# Patient Record
Sex: Female | Born: 1960 | Race: White | Hispanic: No | Marital: Married | Smoking: Never smoker
Health system: Southern US, Community
[De-identification: ages and names within clinical notes are randomized; demographics above are authoritative.]

## PROBLEM LIST (undated history)

## (undated) DIAGNOSIS — R42 Dizziness and giddiness: Secondary | ICD-10-CM

## (undated) DIAGNOSIS — E039 Hypothyroidism, unspecified: Secondary | ICD-10-CM

## (undated) DIAGNOSIS — J329 Chronic sinusitis, unspecified: Secondary | ICD-10-CM

## (undated) DIAGNOSIS — T7840XA Allergy, unspecified, initial encounter: Secondary | ICD-10-CM

## (undated) DIAGNOSIS — M1990 Unspecified osteoarthritis, unspecified site: Secondary | ICD-10-CM

## (undated) DIAGNOSIS — F419 Anxiety disorder, unspecified: Secondary | ICD-10-CM

## (undated) DIAGNOSIS — G4730 Sleep apnea, unspecified: Secondary | ICD-10-CM

## (undated) DIAGNOSIS — E785 Hyperlipidemia, unspecified: Secondary | ICD-10-CM

## (undated) DIAGNOSIS — K219 Gastro-esophageal reflux disease without esophagitis: Secondary | ICD-10-CM

## (undated) HISTORY — PX: TONSILLECTOMY: SUR1361

## (undated) HISTORY — DX: Hyperlipidemia, unspecified: E78.5

## (undated) HISTORY — DX: Allergy, unspecified, initial encounter: T78.40XA

## (undated) HISTORY — PX: CARPAL TUNNEL RELEASE: SHX101

## (undated) HISTORY — DX: Chronic sinusitis, unspecified: J32.9

---

## 2005-04-15 ENCOUNTER — Ambulatory Visit: Payer: Self-pay | Admitting: Internal Medicine

## 2005-05-16 ENCOUNTER — Encounter: Admission: RE | Admit: 2005-05-16 | Discharge: 2005-05-16 | Payer: Self-pay | Admitting: Orthopedic Surgery

## 2005-09-30 ENCOUNTER — Ambulatory Visit: Payer: Self-pay | Admitting: Internal Medicine

## 2005-10-01 ENCOUNTER — Ambulatory Visit: Payer: Self-pay | Admitting: Internal Medicine

## 2006-05-27 ENCOUNTER — Ambulatory Visit: Payer: Self-pay | Admitting: Internal Medicine

## 2006-06-26 ENCOUNTER — Ambulatory Visit: Payer: Self-pay | Admitting: Internal Medicine

## 2006-12-29 ENCOUNTER — Encounter: Admission: RE | Admit: 2006-12-29 | Discharge: 2006-12-29 | Payer: Self-pay | Admitting: Obstetrics and Gynecology

## 2007-02-27 ENCOUNTER — Ambulatory Visit: Payer: Self-pay | Admitting: Internal Medicine

## 2008-01-15 ENCOUNTER — Encounter: Admission: RE | Admit: 2008-01-15 | Discharge: 2008-01-15 | Payer: Self-pay | Admitting: Obstetrics

## 2008-05-20 DIAGNOSIS — J309 Allergic rhinitis, unspecified: Secondary | ICD-10-CM | POA: Insufficient documentation

## 2008-05-20 DIAGNOSIS — L509 Urticaria, unspecified: Secondary | ICD-10-CM | POA: Insufficient documentation

## 2008-05-20 DIAGNOSIS — T783XXA Angioneurotic edema, initial encounter: Secondary | ICD-10-CM | POA: Insufficient documentation

## 2008-05-23 ENCOUNTER — Ambulatory Visit: Payer: Self-pay | Admitting: Internal Medicine

## 2008-05-26 ENCOUNTER — Ambulatory Visit: Payer: Self-pay | Admitting: Internal Medicine

## 2009-02-08 ENCOUNTER — Encounter: Admission: RE | Admit: 2009-02-08 | Discharge: 2009-02-08 | Payer: Self-pay | Admitting: Obstetrics

## 2010-02-26 ENCOUNTER — Encounter: Admission: RE | Admit: 2010-02-26 | Discharge: 2010-02-26 | Payer: Self-pay | Admitting: Obstetrics

## 2010-12-02 ENCOUNTER — Encounter: Payer: Self-pay | Admitting: Orthopedic Surgery

## 2011-03-29 NOTE — Assessment & Plan Note (Signed)
[facility]                               PULMONARY OFFICE NOTE   NAME:Oguinn, IEASHA BOEREMA                       MRN:          [id number]  DATE:06/26/2006                            DOB:          [birth year]    PROBLEMS:  1. Allergic rhinitis.  2. Urticaria.  3. Angioedema.   HISTORY OF PRESENT ILLNESS:  She says this has been a very good year.  In  the last 2-3 days, just as she came back from the beach trip, she has  experienced nasal congestion, retro-orbital pressure and post nasal  drainage.  She saw her eye doctor who gave Alrex drops and Optive lubricant.  She has had no recent urticaria.  She continues allergy vaccine at 1-10,  given by a nurse neighbor, usually every other week.  She has had no  problems at all.  We spent time today discussing allergy vaccine goals,  risks and alternatives with an emphasis on policy concerns of administration  outside of a medical office anaphylaxis, epinephrine and oxygen.  She wishes  to continue as she is doing.  She reviewed and signed a waiver.   MEDICATIONS:  1. Allergy vaccine at 1-10.  2. Zyrtec 10 mg.  3. Wellbutrin.  4. Red rice yeast.   ALLERGIES:  DRUG INTOLERANCE TO PENICILLIN, SULFA, CODEINE, ERYTHROMYCIN AND  KEFLEX.  She has an Epi Pen with prescription refilled.   OBJECTIVE:  VITAL SIGNS:  Weight 272 pounds, blood pressure 110/80, pulse  rate 74, room air saturation 99%.  HEENT:  Periorbital edema.  Conjunctivae are clear.  Nasal mucosa is clear  and looks normal.  Pharynx is clear, but she may be very minimally hoarse  without stridor.  LUNGS:  Fields are clear with breathing unlabored.  HEART:  Sounds regular without murmur.  There is no obvious rash.   IMPRESSION:  Recent exacerbation of rhinitis or rhinosinusitis.  This may  have been a viral syndrome.  Context is not specific.   PLAN:  1. Educational discussion as above with decision to continue allergy      vaccine as she is  doing.  2. Epi Pen discussed and refilled.  3. Nasal Neo-Synephrine inhalation and Depo-Medrol 80 mg IM today.  4. Doxycycline, to hold, 100 mg #8 to the first day and then one daily, as      discussed.  5. Schedule return in one year, earlier p.r.n.                                   Clinton D. Maple Hudson, MD, FCCP, FACP   CDY/MedQ  DD:  06/26/2006  DT:  06/27/2006  Job #:  [id number]

## 2012-02-05 ENCOUNTER — Other Ambulatory Visit: Payer: Self-pay | Admitting: Obstetrics

## 2012-02-05 DIAGNOSIS — Z1231 Encounter for screening mammogram for malignant neoplasm of breast: Secondary | ICD-10-CM

## 2012-02-20 ENCOUNTER — Ambulatory Visit: Payer: Self-pay

## 2012-05-19 ENCOUNTER — Ambulatory Visit: Payer: Self-pay

## 2012-05-20 ENCOUNTER — Ambulatory Visit
Admission: RE | Admit: 2012-05-20 | Discharge: 2012-05-20 | Disposition: A | Discharge status: Home / Self Care | Payer: BC Managed Care – PPO | Source: Ambulatory Visit | Attending: Obstetrics | Admitting: Obstetrics

## 2012-05-20 DIAGNOSIS — Z1231 Encounter for screening mammogram for malignant neoplasm of breast: Secondary | ICD-10-CM

## 2013-01-30 ENCOUNTER — Ambulatory Visit (INDEPENDENT_AMBULATORY_CARE_PROVIDER_SITE_OTHER): Payer: BC Managed Care – PPO | Admitting: Family Medicine

## 2013-01-30 VITALS — BP 110/72 | HR 90 | Temp 98.2°F | Resp 16 | Ht 66.0 in | Wt 271.4 lb

## 2013-01-30 DIAGNOSIS — R059 Cough, unspecified: Secondary | ICD-10-CM

## 2013-01-30 DIAGNOSIS — D72829 Elevated white blood cell count, unspecified: Secondary | ICD-10-CM

## 2013-01-30 DIAGNOSIS — J029 Acute pharyngitis, unspecified: Secondary | ICD-10-CM

## 2013-01-30 DIAGNOSIS — M791 Myalgia, unspecified site: Secondary | ICD-10-CM

## 2013-01-30 DIAGNOSIS — IMO0001 Reserved for inherently not codable concepts without codable children: Secondary | ICD-10-CM

## 2013-01-30 DIAGNOSIS — R05 Cough: Secondary | ICD-10-CM

## 2013-01-30 LAB — POCT CBC
Granulocyte percent: 73.5 %G (ref 37–80)
HCT, POC: 41.4 % (ref 37.7–47.9)
Lymph, poc: 2.3 (ref 0.6–3.4)
MCHC: 31.4 g/dL — AB (ref 31.8–35.4)
MPV: 8.6 fL (ref 0–99.8)
Platelet Count, POC: 331 10*3/uL (ref 142–424)
RBC: 4.76 M/uL (ref 4.04–5.48)
RDW, POC: 13.6 %

## 2013-01-30 LAB — POCT INFLUENZA A/B: Influenza B, POC: NEGATIVE

## 2013-01-30 LAB — POCT RAPID STREP A (OFFICE): Rapid Strep A Screen: NEGATIVE

## 2013-01-30 MED ORDER — MAGIC MOUTHWASH W/LIDOCAINE: 5.0000 mL | Freq: Four times a day (QID) | ORAL | Status: DC | PRN

## 2013-01-30 MED ORDER — AZITHROMYCIN 250 MG PO TABS: ORAL_TABLET | ORAL | Status: DC

## 2013-01-30 NOTE — Patient Instructions (Addendum)
Start the antibiotic, mucinex if needed, magic mouthwash if needed - gargle then spit for sore throat.  Albuterol if wheezing, voice rest, fluids. recheck in next 2-3 days if not improving.  Return to the clinic or go to the nearest emergency room if any of your symptoms worsen or new symptoms occur. Sore Throat Sore throats may be caused by bacteria and viruses. They may also be caused by:  Smoking.  Pollution.  Allergies. If a sore throat is due to strep infection (a bacterial infection), you may need:  A throat swab.  A culture test to verify the strep infection. You will need one of these:  An antibiotic shot.  Oral medicine for a full 10 days. Strep infection is very contagious. A doctor should check any close contacts who have a sore throat or fever. A sore throat caused by a virus infection will usually last only 3-4 days. Antibiotics will not treat a viral sore throat.  Infectious mononucleosis (a viral disease), however, can cause a sore throat that lasts for up to 3 weeks. Mononucleosis can be diagnosed with blood tests. You must have been sick for at least 1 week in order for the test to give accurate results. HOME CARE INSTRUCTIONS   To treat a sore throat, take mild pain medicine.  Increase your fluids.  Eat a soft diet.  Do not smoke.  Gargling with warm water or salt water (1 tsp. salt in 8 oz. water) can be helpful.  Try throat sprays or lozenges or sucking on hard candy to ease the symptoms. Call your doctor if your sore throat lasts longer than 1 week.  SEEK IMMEDIATE MEDICAL CARE IF:  You have difficulty breathing.  You have increased swelling in the throat.  You have pain so severe that you are unable to swallow fluids or your saliva.  You have a severe headache, a high fever, vomiting, or a red rash. Document Released: 12/05/2004 Document Revised: 01/20/2012 Document Reviewed: 10/15/2007 [facility] Patient Information 2013 [place], [place].   Cough,  Adult  A cough is a reflex that helps clear your throat and airways. It can help heal the body or may be a reaction to an irritated airway. A cough may only last 2 or 3 weeks (acute) or may last more than 8 weeks (chronic).  CAUSES Acute cough:  Viral or bacterial infections. Chronic cough:  Infections.  Allergies.  Asthma.  Post-nasal drip.  Smoking.  Heartburn or acid reflux.  Some medicines.  Chronic lung problems (COPD).  Cancer. SYMPTOMS   Cough.  Fever.  Chest pain.  Increased breathing rate.  High-pitched whistling sound when breathing (wheezing).  Colored mucus that you cough up (sputum). TREATMENT   A bacterial cough may be treated with antibiotic medicine.  A viral cough must run its course and will not respond to antibiotics.  Your caregiver may recommend other treatments if you have a chronic cough. HOME CARE INSTRUCTIONS   Only take over-the-counter or prescription medicines for pain, discomfort, or fever as directed by your caregiver. Use cough suppressants only as directed by your caregiver.  Use a cold steam vaporizer or humidifier in your bedroom or home to help loosen secretions.  Sleep in a semi-upright position if your cough is worse at night.  Rest as needed.  Stop smoking if you smoke. SEEK IMMEDIATE MEDICAL CARE IF:   You have pus in your sputum.  Your cough starts to worsen.  You cannot control your cough with suppressants and are losing  sleep.  You begin coughing up blood.  You have difficulty breathing.  You develop pain which is getting worse or is uncontrolled with medicine.  You have a fever. MAKE SURE YOU:   Understand these instructions.  Will watch your condition.  Will get help right away if you are not doing well or get worse. Document Released: 04/26/2011 Document Revised: 01/20/2012 Document Reviewed: 04/26/2011 [facility] Patient Information 2013 [place], [place].

## 2013-01-30 NOTE — Progress Notes (Signed)
Subjective:    Patient ID: Barbara Michael, female    DOB: [birth year], 52 y.o.   MRN: [id number]  HPI Barbara Michael is a 52 y.o. female Started 3 days ago with sneezing, bodyaches.  initially thought was allergies. Asthma in past - but hasn't needed albuterol in years.  [facility],  rx mucinex DM and albuterol.  Feels worse today.  Bodyache, R and L ear sore.  Hurts to talk. Breathing is better.   Did not have flu vaccine - allergic to eggs.  SH: teacher - special needs.    Rash with erythromycin - but has taken azithro without difficulty.   Review of Systems  Constitutional: Positive for chills. Negative for fever.  HENT: Positive for ear pain, congestion, rhinorrhea and voice change.   Respiratory: Positive for cough (green sputum. ) and wheezing (past few days. using albuterol twice since yesterday. ).   Neurological: Negative for tremors.       Objective:   Physical Exam  Vitals reviewed. Constitutional: She is oriented to person, place, and time. She appears well-developed and well-nourished. No distress.  HENT:  Head: Normocephalic and atraumatic.  Right Ear: Hearing, tympanic membrane, external ear and ear canal normal.  Left Ear: Hearing, tympanic membrane, external ear and ear canal normal.  Nose: Nose normal. Right sinus exhibits no maxillary sinus tenderness and no frontal sinus tenderness. Left sinus exhibits no maxillary sinus tenderness and no frontal sinus tenderness.  Mouth/Throat: Oropharynx is clear and moist. No oropharyngeal exudate.  Hoarse voice.   Eyes: Conjunctivae and EOM are normal. Pupils are equal, round, and reactive to light.  Neck: Neck supple.  Cardiovascular: Normal rate, regular rhythm, normal heart sounds and intact distal pulses.   No murmur heard. Pulmonary/Chest: Effort normal and breath sounds normal. No respiratory distress. She has no wheezes. She has no rhonchi.  Lymphadenopathy:    She has no cervical  adenopathy.  Neurological: She is alert and oriented to person, place, and time.  Skin: Skin is warm and dry. No rash noted.  Psychiatric: She has a normal mood and affect. Her behavior is normal.   Results for orders placed in visit on 01/30/13  POCT CBC      Result Value Range   WBC 11.1 (*) 4.6 - 10.2 K/uL   Lymph, poc 2.3  0.6 - 3.4   POC LYMPH PERCENT 20.9  10 - 50 %L   MID (cbc) 0.6  0 - 0.9   POC MID % 5.6  0 - 12 %M   POC Granulocyte 8.2 (*) 2 - 6.9   Granulocyte percent 73.5  37 - 80 %G   RBC 4.76  4.04 - 5.48 M/uL   Hemoglobin 13.0  12.2 - 16.2 g/dL   HCT, POC 78.2  95.6 - 47.9 %   MCV 86.9  80 - 97 fL   MCH, POC 27.3  27 - 31.2 pg   MCHC 31.4 (*) 31.8 - 35.4 g/dL   RDW, POC 21.3     Platelet Count, POC 331  142 - 424 K/uL   MPV 8.6  0 - 99.8 fL  POCT RAPID STREP A (OFFICE)      Result Value Range   Rapid Strep A Screen Negative  Negative  POCT INFLUENZA A/B      Result Value Range   Influenza A, POC Negative     Influenza B, POC Negative         Assessment & Plan:  Barbara Michael is a 52 y.o. female Cough - Plan: POCT CBC, POCT rapid strep A, POCT Influenza A/B, azithromycin (ZITHROMAX) 250 MG tablet  Myalgia - Plan: POCT CBC, POCT rapid strep A, POCT Influenza A/B  Sore throat - Plan: Alum & Mag Hydroxide-Simeth (MAGIC MOUTHWASH W/LIDOCAINE) SOLN  Leukocytosis, unspecified - Plan: azithromycin (ZITHROMAX) 250 MG tablet   URI, cough sore throat - possible viral illness, but slight elevated WBC, - early bronchitis or CAP in ddx. Lungs clear on exam, cxr deferred today.  Start zpak, cont mucinex, sx care, MMW, rtc precautions.   Neck soreness- more anterior in description.  Not photophobic, and supple neck exam.  Rtc/er precautions.    Patient Instructions  Start the antibiotic, mucinex if needed, magic mouthwash if needed - gargle then spit for sore throat.  Albuterol if wheezing, voice rest, fluids. recheck in next 2-3 days if not improving.  Return to the  clinic or go to the nearest emergency room if any of your symptoms worsen or new symptoms occur. Sore Throat Sore throats may be caused by bacteria and viruses. They may also be caused by:  Smoking.  Pollution.  Allergies. If a sore throat is due to strep infection (a bacterial infection), you may need:  A throat swab.  A culture test to verify the strep infection. You will need one of these:  An antibiotic shot.  Oral medicine for a full 10 days. Strep infection is very contagious. A doctor should check any close contacts who have a sore throat or fever. A sore throat caused by a virus infection will usually last only 3-4 days. Antibiotics will not treat a viral sore throat.  Infectious mononucleosis (a viral disease), however, can cause a sore throat that lasts for up to 3 weeks. Mononucleosis can be diagnosed with blood tests. You must have been sick for at least 1 week in order for the test to give accurate results. HOME CARE INSTRUCTIONS   To treat a sore throat, take mild pain medicine.  Increase your fluids.  Eat a soft diet.  Do not smoke.  Gargling with warm water or salt water (1 tsp. salt in 8 oz. water) can be helpful.  Try throat sprays or lozenges or sucking on hard candy to ease the symptoms. Call your doctor if your sore throat lasts longer than 1 week.  SEEK IMMEDIATE MEDICAL CARE IF:  You have difficulty breathing.  You have increased swelling in the throat.  You have pain so severe that you are unable to swallow fluids or your saliva.  You have a severe headache, a high fever, vomiting, or a red rash. Document Released: 12/05/2004 Document Revised: 01/20/2012 Document Reviewed: 10/15/2007 [facility] Patient Information 2013 [place], [place].   Cough, Adult  A cough is a reflex that helps clear your throat and airways. It can help heal the body or may be a reaction to an irritated airway. A cough may only last 2 or 3 weeks (acute) or may last more than  8 weeks (chronic).  CAUSES Acute cough:  Viral or bacterial infections. Chronic cough:  Infections.  Allergies.  Asthma.  Post-nasal drip.  Smoking.  Heartburn or acid reflux.  Some medicines.  Chronic lung problems (COPD).  Cancer. SYMPTOMS   Cough.  Fever.  Chest pain.  Increased breathing rate.  High-pitched whistling sound when breathing (wheezing).  Colored mucus that you cough up (sputum). TREATMENT   A bacterial cough may be treated with antibiotic medicine.  A viral  cough must run its course and will not respond to antibiotics.  Your caregiver may recommend other treatments if you have a chronic cough. HOME CARE INSTRUCTIONS   Only take over-the-counter or prescription medicines for pain, discomfort, or fever as directed by your caregiver. Use cough suppressants only as directed by your caregiver.  Use a cold steam vaporizer or humidifier in your bedroom or home to help loosen secretions.  Sleep in a semi-upright position if your cough is worse at night.  Rest as needed.  Stop smoking if you smoke. SEEK IMMEDIATE MEDICAL CARE IF:   You have pus in your sputum.  Your cough starts to worsen.  You cannot control your cough with suppressants and are losing sleep.  You begin coughing up blood.  You have difficulty breathing.  You develop pain which is getting worse or is uncontrolled with medicine.  You have a fever. MAKE SURE YOU:   Understand these instructions.  Will watch your condition.  Will get help right away if you are not doing well or get worse. Document Released: 04/26/2011 Document Revised: 01/20/2012 Document Reviewed: 04/26/2011 [facility] Patient Information 2013 [place], [place].    Meds ordered this encounter                                .       . azithromycin (ZITHROMAX) 250 MG tablet    Sig: Take 2 pills by mouth on day 1, then 1 pill by mouth per day on days 2 through 5.    Dispense:  6 each     Refill:  0  . Alum & Mag Hydroxide-Simeth (MAGIC MOUTHWASH W/LIDOCAINE) SOLN    Sig: Take 5 mLs by mouth 4 (four) times daily as needed.    Dispense:  120 mL    Refill:  0    Ok to substitute ingredients per pharmacy usual "magic mouthwash" prep.

## 2013-02-03 ENCOUNTER — Telehealth: Payer: Self-pay

## 2013-02-03 NOTE — Telephone Encounter (Signed)
Called her, advised she should rest her voice, this is the best treatment.

## 2013-02-03 NOTE — Telephone Encounter (Signed)
Pt finished meds from Saturday and has no voice.   [phone] (H)

## 2013-05-12 ENCOUNTER — Telehealth (HOSPITAL_COMMUNITY): Payer: Self-pay | Admitting: Dietician

## 2013-05-12 NOTE — Telephone Encounter (Signed)
Received referral via fax from Dr. Margo Aye for dx: obesity.

## 2013-05-12 NOTE — Telephone Encounter (Signed)
Called at [phone]. Appointment scheduled for 05/21/13 at 1030.

## 2013-05-21 ENCOUNTER — Encounter (HOSPITAL_COMMUNITY): Payer: Self-pay | Admitting: Dietician

## 2013-05-21 DIAGNOSIS — J329 Chronic sinusitis, unspecified: Secondary | ICD-10-CM | POA: Insufficient documentation

## 2013-05-21 DIAGNOSIS — E785 Hyperlipidemia, unspecified: Secondary | ICD-10-CM | POA: Insufficient documentation

## 2013-05-21 NOTE — Progress Notes (Signed)
Outpatient Initial Nutrition Assessment  Date:05/21/2013   Appt Start Time: 1036  Referring Physician: Dr. Margo Aye Reason for Visit: obesity  Nutrition Assessment:  Height: 5\' 7"  (170.2 cm)   Weight: 270 lb (122.471 kg)   IBW: 135# %IBW: 200% UBW: 220# %UBW: 123%  Body mass index is 42.28 kg/(m^2). Meets criteria for extreme obesity, class III. Goal Weight: 243# (10% loss of current weight) Weight hx: Pt reports UBW of 220#. She reports her lowest weight was 200#, when she got married 29 years ago. She reports progressive weight gain since having children.   Estimated nutritional needs: 1800-1900 kcals daily, 98-122 grams protein daily, 1.8-1.9 L fluid daily  PMH:  Past Medical History  Diagnosis Date  . Allergy   . Sinusitis   . Dyslipidemia     Medications:  Current Outpatient Rx  Name  Route  Sig  Dispense  Refill  . VITAMIN D, ERGOCALCIFEROL, PO   Oral   Take by mouth.         . Alum & Mag Hydroxide-Simeth (MAGIC MOUTHWASH W/LIDOCAINE) SOLN   Oral   Take 5 mLs by mouth 4 (four) times daily as needed.   120 mL   0     Ok to substitute ingredients per pharmacy usual "m ...   . Ascorbic Acid (VITAMIN C) 1000 MG tablet   Oral   Take 1,000 mg by mouth daily.         Marland Kitchen azithromycin (ZITHROMAX) 250 MG tablet      Take 2 pills by mouth on day 1, then 1 pill by mouth per day on days 2 through 5.   6 each   0   . calcium citrate (CALCITRATE - DOSED IN MG ELEMENTAL CALCIUM) 950 MG tablet   Oral   Take 1 tablet by mouth daily.         . fish oil-omega-3 fatty acids 1000 MG capsule   Oral   Take 2 g by mouth daily.         Marland Kitchen levocetirizine (XYZAL) 5 MG tablet   Oral   Take 5 mg by mouth every evening.         Marland Kitchen omeprazole (PRILOSEC) 10 MG capsule   Oral   Take 10 mg by mouth daily.           Labs: CMP  No results found for this basename: na, k, cl, co2, glucose, bun, creatinine, calcium, prot, albumin, ast, alt, alkphos, bilitot, gfrnonaa, gfraa    Lipid Panel  No results found for this basename: chol, trig, hdl, cholhdl, vldl, ldlcalc     No results found for this basename: HGBA1C   No results found for this basename: GLUF, MICROALBUR, LDLCALC, CREATININE     Lifestyle/ social habits: Barbara Michael is a very pleasant woman who lives in [place] with her husband. She has 2 adult children who lives in [place]. She reports her stress level as 5/10 on average. She reports that her stress level has been high lately, due to a busy lifestyle. She desires to lose weight for both her children's weddings next year. She joined [employer] one week ago and started exercising by walking 3 times a week for 30 minutes.   Nutrition hx/habits: Ms. Weightman reports that she has tried to be more conscious of her potions since starting [employer]. She has lost 5# since starting. She has not been tracking her foods as she should, due to being extremely busy. She admits that she is  a stress eater and this is her biggest downfall. She also considers breads, carbs, and sweets as her weakness. She is also aware that she needs to decrease sweetened beverages. She is concerned about her high cholesterol and would like to lose weight, as obesity is very prevalent in her family.   Diet recall: Breakfast: 2 egg whites, bacon, english muffin or whole wheat toast OR oatmeal OR green smoothie with greek yogurt, berries, and kale; Lunch: [employer] grilled chicken wrap with out dressing (grilled chicken, tortilla, and cheese), iced tea; Dinner: spaghetti OR grilled chicken OR bowl of cereal.   Nutrition Diagnosis: Involuntary weight gain r/t excessive energy intake, physical inactivity AEB BMI: 42.28.  Nutrition Intervention: Nutrition rx: 1500 kcal NAS, no sugar added diet; 3 meals per day; low calorie beverages only; 30 minutes physical activity daily  Education/Counseling Provided:Educated pt on principles of weight management. Discussed principles of energy expenditure  and how changes in diet and physical activity affect weight status. Discussed nutritional content of commonly eaten foods and suggested healthier alternatives. Educated pt on plate method and a general, healthful diet that includes low fat dairy, lean meats, whole fruits and vegetables, and whole grains most often. Discussed importance of a healthy diet along with regular physical activity (at least 30 minutes 5 times per week) to achieve weight loss goals. Encouraged slow, moderate weight loss (0.5-2# weight loss per week) and adopting healthy lifestyle changes vs. obtaining a certain body type or weight. Encouraged weighing self weekly at a consistent day and time of choice. Showed pt functionality of MyFitnessPal and encouraged using a food diary to better track caloric intake. Provided "Destination: [facility] Eating"Used TeachBack to assess understanding.   Understanding, Motivation, Ability to Follow Recommendations: Expect fair to good compliance.   Monitoring and Evaluation: Goals: 1) 0.5-2# weight loss per week; 2) 30 minutes physical activity daily  Recommendations: 1) Continue with [employer]; 2) Keep Food Diary; 3) Increase exercise to 30 minutes daily  F/U: 4-6 weeks. 06/21/13 at 1000.  Melody Haver, RD, LDN 05/21/2013  Appt EndTime: 1120

## 2013-06-21 ENCOUNTER — Telehealth (HOSPITAL_COMMUNITY): Payer: Self-pay | Admitting: Dietician

## 2013-06-21 NOTE — Telephone Encounter (Signed)
Pt was a no-show for appointment scheduled for 06/21/2013 at 1000. Sent letter to pt home notifying pt of no-show and requesting rescheduling appointment.

## 2014-05-02 ENCOUNTER — Other Ambulatory Visit: Payer: Self-pay

## 2014-05-02 DIAGNOSIS — Z1231 Encounter for screening mammogram for malignant neoplasm of breast: Secondary | ICD-10-CM

## 2014-05-04 ENCOUNTER — Ambulatory Visit
Admission: RE | Admit: 2014-05-04 | Discharge: 2014-05-04 | Disposition: A | Discharge status: Home / Self Care | Payer: BC Managed Care – PPO | Source: Ambulatory Visit

## 2014-05-04 ENCOUNTER — Ambulatory Visit: Payer: BC Managed Care – PPO

## 2014-05-04 DIAGNOSIS — Z1231 Encounter for screening mammogram for malignant neoplasm of breast: Secondary | ICD-10-CM

## 2014-09-21 ENCOUNTER — Ambulatory Visit: Payer: BC Managed Care – PPO | Admitting: Podiatry

## 2015-05-19 ENCOUNTER — Other Ambulatory Visit (HOSPITAL_COMMUNITY): Payer: Self-pay | Admitting: Respiratory Therapy

## 2015-05-19 DIAGNOSIS — G4730 Sleep apnea, unspecified: Secondary | ICD-10-CM

## 2015-05-19 DIAGNOSIS — G4710 Hypersomnia, unspecified: Secondary | ICD-10-CM

## 2015-05-19 DIAGNOSIS — R0683 Snoring: Secondary | ICD-10-CM

## 2015-05-24 ENCOUNTER — Other Ambulatory Visit (HOSPITAL_COMMUNITY): Payer: Self-pay | Admitting: Respiratory Therapy

## 2015-06-09 ENCOUNTER — Ambulatory Visit: Payer: BC Managed Care – PPO | Attending: Internal Medicine | Admitting: Sleep Medicine

## 2015-06-09 DIAGNOSIS — G4730 Sleep apnea, unspecified: Secondary | ICD-10-CM

## 2015-06-09 DIAGNOSIS — G4733 Obstructive sleep apnea (adult) (pediatric): Secondary | ICD-10-CM | POA: Insufficient documentation

## 2015-06-09 DIAGNOSIS — R0683 Snoring: Secondary | ICD-10-CM | POA: Insufficient documentation

## 2015-06-09 DIAGNOSIS — G4710 Hypersomnia, unspecified: Secondary | ICD-10-CM

## 2015-06-12 ENCOUNTER — Ambulatory Visit (INDEPENDENT_AMBULATORY_CARE_PROVIDER_SITE_OTHER): Payer: BC Managed Care – PPO

## 2015-06-12 ENCOUNTER — Ambulatory Visit (INDEPENDENT_AMBULATORY_CARE_PROVIDER_SITE_OTHER): Payer: BC Managed Care – PPO | Admitting: Podiatry

## 2015-06-12 ENCOUNTER — Encounter: Payer: Self-pay | Admitting: Podiatry

## 2015-06-12 VITALS — BP 125/88 | HR 70 | Resp 16 | Ht 66.0 in | Wt 271.0 lb

## 2015-06-12 DIAGNOSIS — M779 Enthesopathy, unspecified: Secondary | ICD-10-CM | POA: Diagnosis not present

## 2015-06-12 DIAGNOSIS — L989 Disorder of the skin and subcutaneous tissue, unspecified: Secondary | ICD-10-CM | POA: Diagnosis not present

## 2015-06-12 DIAGNOSIS — G5792 Unspecified mononeuropathy of left lower limb: Secondary | ICD-10-CM

## 2015-06-12 DIAGNOSIS — M79673 Pain in unspecified foot: Secondary | ICD-10-CM

## 2015-06-12 DIAGNOSIS — R234 Changes in skin texture: Secondary | ICD-10-CM

## 2015-06-12 NOTE — Progress Notes (Signed)
   Subjective:    Patient ID: Barbara Michael, female    DOB: 1961/04/14, 54 y.o.   MRN: [id number]  HPI Comments: "I want some things checked"  Patient c/o tender, numbness 1st toe left for about 2 years. She had an injury in [facility] where she caught her toenail on a box. She went to Urgent Care and they numbed 3 times and then did something with the toenail. The nail was super glued back on. Ever since she has had numbness in the toe, more toward tip.   Also, concerned about cracking plantar/lateral foot bilateral. Uses lotions daily. Any recommendations?  states that she wears closed shoes during the winter and open sandals during the summer where the cracks seem to be worse during the summer.    Review of Systems  HENT: Positive for sinus pressure and sore throat.   Musculoskeletal: Positive for arthralgias.  Allergic/Immunologic: Positive for environmental allergies and food allergies.  Neurological: Positive for numbness.  All other systems reviewed and are negative.      Objective:   Physical Exam: I have reviewed her past medical history medications allergy surgery social history review of systems. Pulses are strongly palpable. Neurologic sensorium is intact per Semmes-Weinstein monofilament. There she does demonstrate a definite difference in sensation along the medial dorsal subcutaneous nerve of the hallux left. The deep peroneal nerve is intact with no change. Deep tendon reflexes are intact bilateral brisk and equal bilateral. Muscle strength +5 over 5 dorsiflexion and plantar flexors and inverters and everters all intrinsic musculature is intact. Orthopedic evaluation demonstrate all joints distal to the ankle have full range of motion without crepitation. Cutaneous evaluation demonstrate supple well-hydrated cutis with exception of friction and calluses to the plantar aspect of the plantar lateral foot bilaterally. The skin fissures are primarily due to shoe gear  and the lack of moisture.        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  Plan: Discussed appropriate shoe gear stretching exercises ice therapy and sugar modifications. I explained to her that if her neuritis is not resolved in 6 months to a year from the date of injury more than likely it was gone be permanent she understands this and is amenable to it. I also discussed appropriate shoe gear regarding the skin fissures. I also suggested that she purchase o'Keefe's cream. Follow up with her as needed

## 2015-06-12 NOTE — Sleep Study (Signed)
  [place] A. Merlene Laughter, MD     [email or web address]             NOCTURNAL POLYSOMNOGRAPHY   LOCATION: [facility]  Patient Name: [name], [name] Date: 06/09/2015 Gender: Female D.O.B: 1961/01/17 Age (years): 53 Referring Provider: Delphina Cahill Height (inches): 27 Interpreting Physician: Phillips Odor MD, ABSM Weight (lbs): 270 RPSGT: Rosebud Poles BMI: 42 MRN: [id number] Neck Size: 17.00 CLINICAL INFORMATION Sleep Study Type: NPSG  Indication for sleep study: Excessive Daytime Sleepiness, Snoring  Epworth Sleepiness Score: 18  MEDICATIONS Medications taken by the patient : N/A  Medications administered by patient during sleep study : No sleep medicine administered. SLEEP STUDY TECHNIQUE As per the AASM Manual for the Scoring of Sleep and Associated Events v2.3 (April 2016) with a hypopnea requiring 4% desaturations.  The channels recorded and monitored were frontal, central and occipital EEG, electrooculogram (EOG), submentalis EMG (chin), nasal and oral airflow, thoracic and abdominal wall motion, anterior tibialis EMG, snore microphone, electrocardiogram, and pulse oximetry.  RESPIRATORY PARAMETERS There were a total of 88 respiratory disturbances out of which 36 were apneas ( 34 obstructive, 2 mixed, 0 central) and 52 hypopneas. The apnea/hypopnea index (AHI) was 15.4 events/hour. The central sleep apnea index was 0.0 events/hour. The REM AHI was 54.8 events/hour and NREM AHI was 5.7 events/hour. The supine AHI was 26.0 events/hour and the non supine AHI was 1.61 supine during 56.59% of sleep. Respiratory disturbances were associated with oxygen desaturation down to a nadir of 72.00% during sleep. The mean oxygen saturation during the study was 93.96%.  SLEEP ARCHITECTURE The study was initiated at 10:12:09 PM and terminated at 4:35:43 AM. The total recorded time was 383.6 minutes. EEG confirmed total sleep time was 342.9 minutes yielding a sleep  efficiency of 89.4%. Sleep onset after lights out was 25.7 minutes with a REM latency of 89.0 minutes. The patient spent 2.92% of the night in stage N1 sleep, 58.15% in stage N2 sleep, 18.81% in stage N3 and 20.12% in REM. Wake after sleep onset (WASO) was 15.0 minutes. The Arousal Index was 6.8/hour. Split Night criteria was not met.  LEG MOVEMENT DATA The total Periodic Limb Movements of Sleep (PLMS) were 0. The PLMS index was 0.00 .  CARDIAC DATA The 2 lead EKG demonstrated sinus rhythm. The mean heart rate was 69.33 beats per minute. Other EKG findings include: None.  IMPRESSIONS Moderate mostly REM relatedobstructive sleep apnea occurred during this study (AHI = 15.4/hour). No significant central sleep apnea occurred during this study (CAI = 0.0). Severe oxygen desaturation was noted during this study (Min O2 = 72.00). The patient snored with Loud snoring volume during the diagnostic portion of the study. No cardiac abnormalities were noted during this study. Clinically significant periodic limb movements did not occur during sleep.  RECOMMENDATIONS Therapeutic CPAP titration to determine optimal pressure required to alleviate sleep disordered breathing.     Delano Metz, MD Diplomate, American Board of Sleep Medicine.

## 2015-06-16 ENCOUNTER — Other Ambulatory Visit (HOSPITAL_COMMUNITY): Payer: Self-pay | Admitting: Respiratory Therapy

## 2015-06-22 ENCOUNTER — Other Ambulatory Visit (HOSPITAL_COMMUNITY): Payer: Self-pay | Admitting: Respiratory Therapy

## 2015-06-22 DIAGNOSIS — G4730 Sleep apnea, unspecified: Secondary | ICD-10-CM

## 2015-06-30 ENCOUNTER — Other Ambulatory Visit (HOSPITAL_COMMUNITY): Payer: Self-pay | Admitting: Respiratory Therapy

## 2015-07-18 ENCOUNTER — Ambulatory Visit: Payer: BC Managed Care – PPO | Attending: Internal Medicine | Admitting: Sleep Medicine

## 2015-07-18 DIAGNOSIS — G4730 Sleep apnea, unspecified: Secondary | ICD-10-CM

## 2015-07-18 DIAGNOSIS — G4733 Obstructive sleep apnea (adult) (pediatric): Secondary | ICD-10-CM | POA: Diagnosis present

## 2015-07-23 NOTE — Sleep Study (Signed)
  [facility] [place] A. Gerilyn Pilgrim, MD     [email or web address]             NOCTURNAL POLYSOMNOGRAPHY   LOCATION: [facility]  Patient Name: Barbara Michael, Barbara Michael Date: 07/18/2015 Gender: Female D.O.B: 1961-09-12 Age (years): 53 Referring Provider: Catalina Pizza Height (inches): 67 Interpreting Physician: Beryle Beams MD, ABSM Weight (lbs): 270 RPSGT: Alfonso Ellis BMI: 42 MRN: [id number] Neck Size: 17.00 CLINICAL INFORMATION The patient is referred for a CPAP titration to treat sleep apnea.  Date of NPSG, Split Night or HST:  SLEEP STUDY TECHNIQUE As per the AASM Manual for the Scoring of Sleep and Associated Events v2.3 (April 2016) with a hypopnea requiring 4% desaturations.  The channels recorded and monitored were frontal, central and occipital EEG, electrooculogram (EOG), submentalis EMG (chin), nasal and oral airflow, thoracic and abdominal wall motion, anterior tibialis EMG, snore microphone, electrocardiogram, and pulse oximetry. Continuous positive airway pressure (CPAP) was initiated at the beginning of the study and titrated to treat sleep-disordered breathing.  MEDICATIONS Medications taken by the patient : N/A  Medications administered by patient during sleep study : No sleep medicine administered. TECHNICIAN COMMENTS Comments added by technician: Patient tolerated CPAP very well.  Comments added by scorer: N/A RESPIRATORY PARAMETERS Optimal PAP Pressure (cm): 7 AHI at Optimal Pressure (/hr): 0.5 Overall Minimal O2 (%): 91.00 Supine % at Optimal Pressure (%): 100 Minimal O2 at Optimal Pressure (%): 91.0     SLEEP ARCHITECTURE The study was initiated at 10:28:53 PM and ended at 5:22:37 AM.  Sleep onset time was 9.1 minutes and the sleep efficiency was 95.8%. The total sleep time was 396.2 minutes.  The patient spent 1.77% of the night in stage N1 sleep, 60.37% in stage N2 sleep, 15.90% in stage N3 and 21.96% in REM.Stage REM latency was 73.0  minutes  Wake after sleep onset was 8.5. Alpha intrusion was absent. Supine sleep was 76.65%.  CARDIAC DATA The 2 lead EKG demonstrated sinus rhythm. The mean heart rate was 71.20 beats per minute. Other EKG findings include: None. LEG MOVEMENT DATA The total Periodic Limb Movements of Sleep (PLMS) were 0. The PLMS index was 0.00. A PLMS index of <15 is considered normal in adults.  IMPRESSIONS The optimal PAP pressure was 7 cm of water.     Argie Ramming, MD Diplomate, American Board of Sleep Medicine.

## 2015-07-24 ENCOUNTER — Other Ambulatory Visit (HOSPITAL_COMMUNITY): Payer: Self-pay | Admitting: Respiratory Therapy

## 2015-10-18 ENCOUNTER — Encounter: Payer: Self-pay | Admitting: Internal Medicine

## 2015-10-23 ENCOUNTER — Encounter: Payer: Self-pay | Admitting: Internal Medicine

## 2017-11-11 HISTORY — PX: LUMBAR LAMINECTOMY/DECOMPRESSION WITH DISCECTOMY: SHX7439

## 2017-12-25 ENCOUNTER — Ambulatory Visit: Payer: BC Managed Care – PPO | Admitting: Neurology

## 2018-07-29 ENCOUNTER — Other Ambulatory Visit: Payer: Self-pay | Admitting: Student

## 2018-07-29 DIAGNOSIS — M21372 Foot drop, left foot: Secondary | ICD-10-CM

## 2018-08-04 ENCOUNTER — Ambulatory Visit
Admission: RE | Admit: 2018-08-04 | Discharge: 2018-08-04 | Disposition: A | Discharge status: Home / Self Care | Payer: BC Managed Care – PPO | Source: Ambulatory Visit | Attending: Student | Admitting: Student

## 2018-08-04 ENCOUNTER — Encounter: Payer: Self-pay | Admitting: Radiology

## 2018-08-04 DIAGNOSIS — M21372 Foot drop, left foot: Secondary | ICD-10-CM

## 2019-07-01 IMAGING — MR MR LUMBAR SPINE W/O CM
4 of 5 series · 26 of 48 positions shown · non-contrast
Comparison: 05/16/2005.

CLINICAL DATA: LEFT foot drop.  Symptoms for 6 months.

EXAM:
MRI LUMBAR SPINE WITHOUT CONTRAST
TECHNIQUE: Multiplanar, multisequence MR imaging of the lumbar spine was
performed. No intravenous contrast was administered.

[Series 3: T2 post-contrast · sagittal · 4.0mm · 0.55mm/px · 5 of 17 slices shown]
[im 1/17]
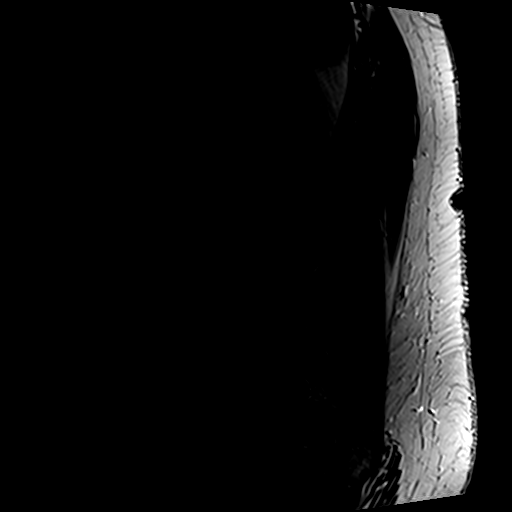
[im 5/17]
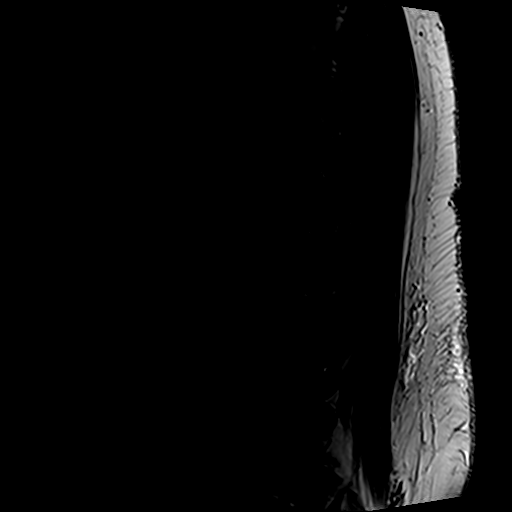
[im 9/17]
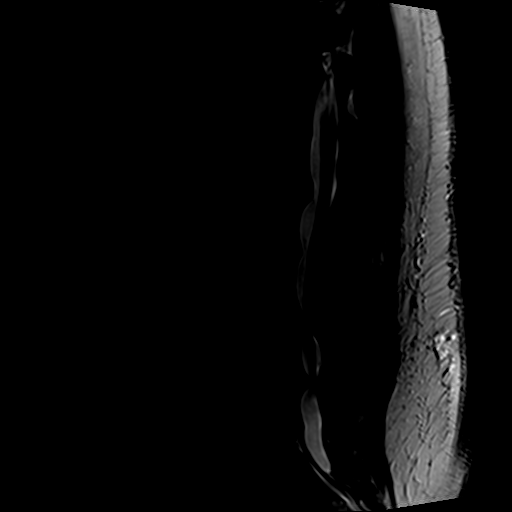
[im 13/17]
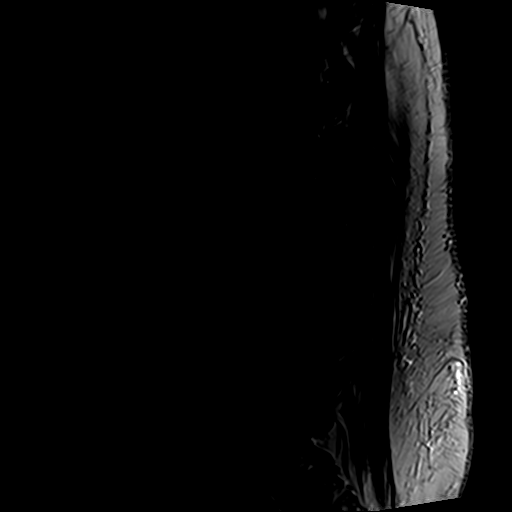
[im 17/17]
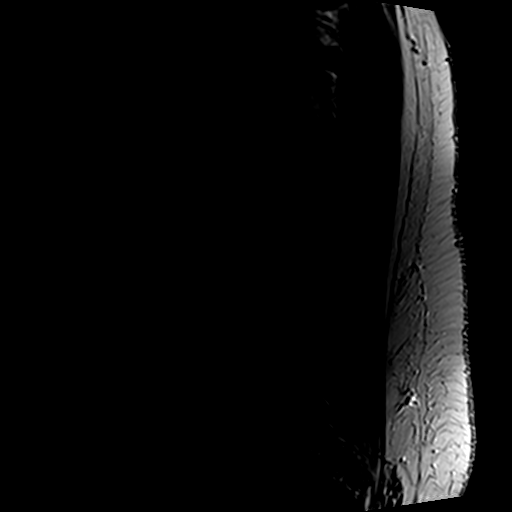

[Series 5: T1 · sagittal · 4.0mm · 0.55mm/px · 6 of 17 slices shown (1 of 2)]
[im 1/17]
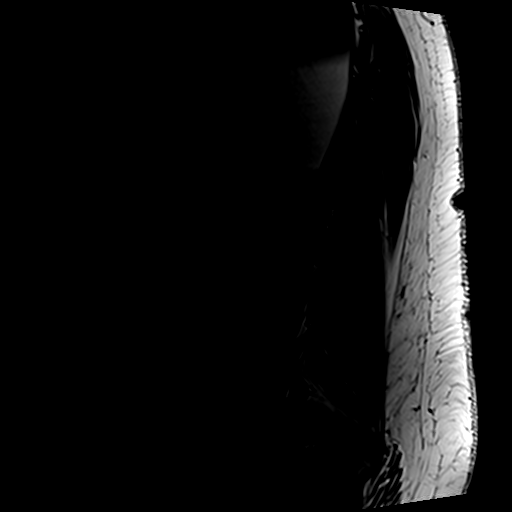
[im 4/17]
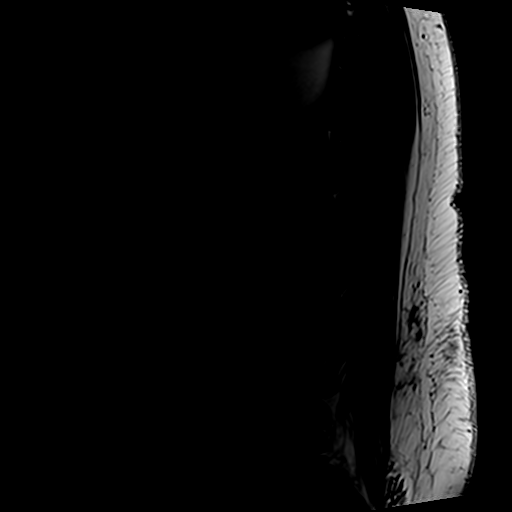
[im 7/17]
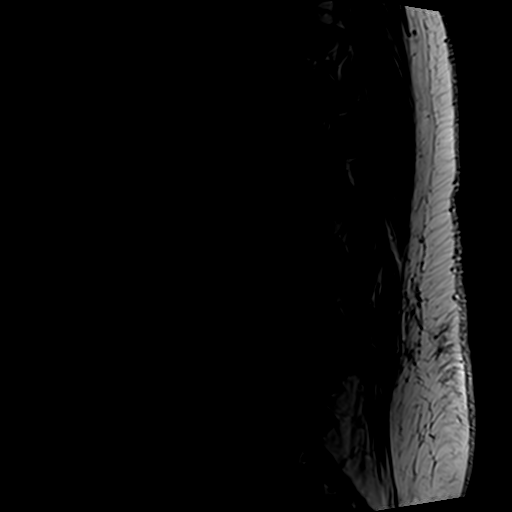
[im 10/17]
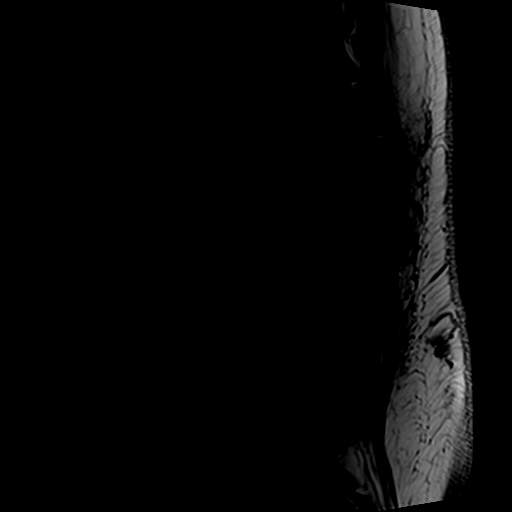
[im 13/17]
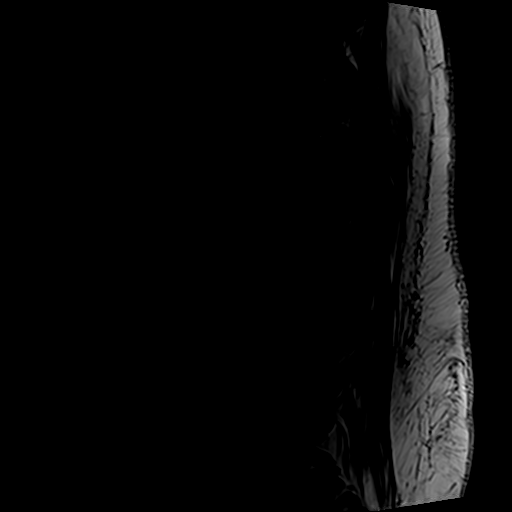
[im 17/17]
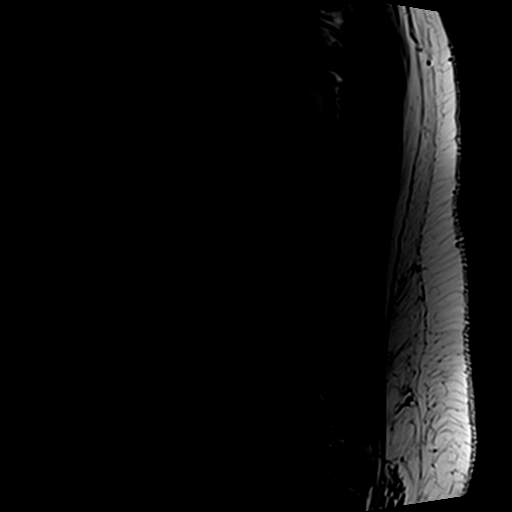

[Series 6: T1 · axial · 4.0mm · 0.35mm/px · z∈[-17,+174]mm · 5 of 47 slices shown (2 of 2)]
[im 4/47]
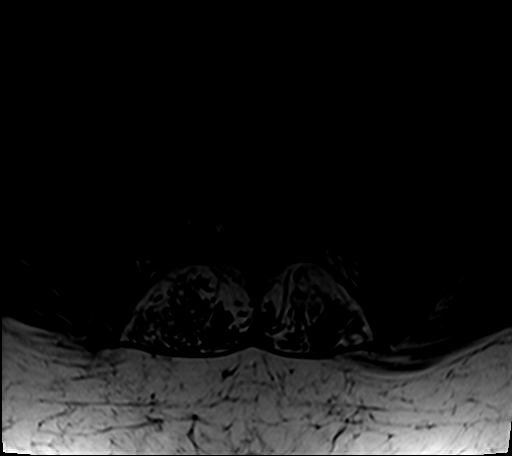
[im 7/47]
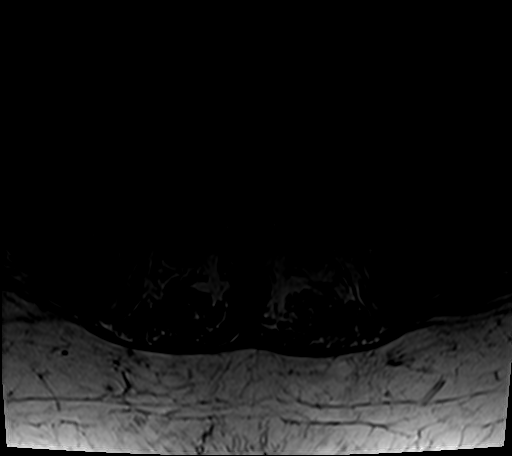
[im 10/47]
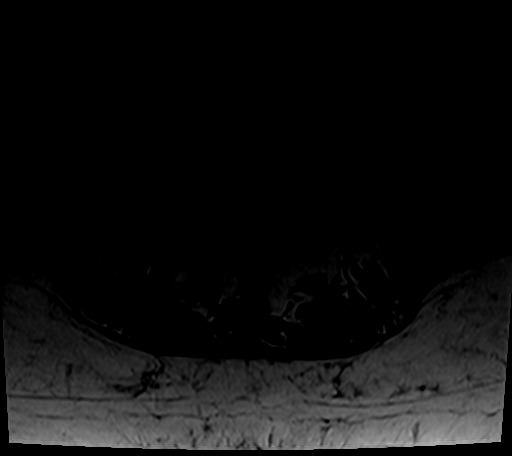
[im 25/47]
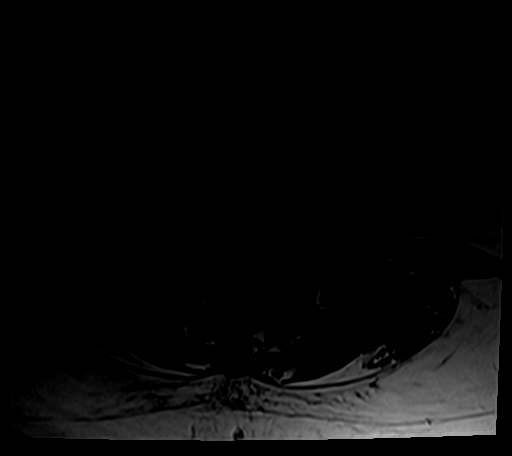
[im 40/47]
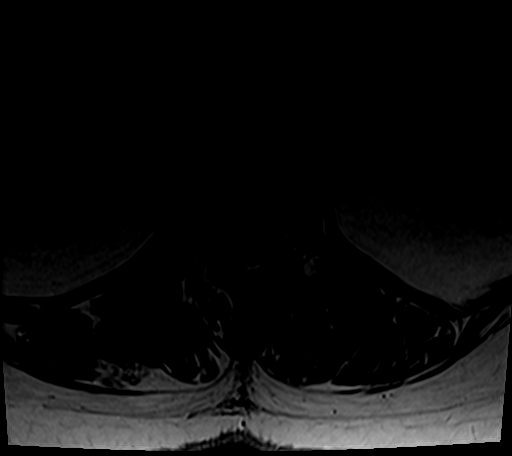

[Series 7: T2 · axial · 4.0mm · 0.70mm/px · z∈[-17,+210]mm · 10 of 47 slices shown]
[im 4/47]
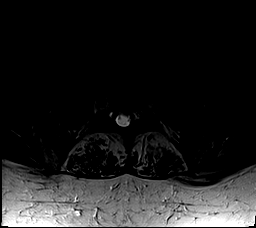
[im 7/47]
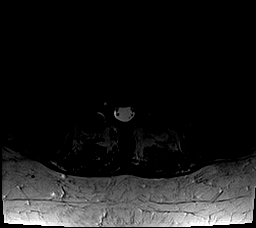
[im 10/47]
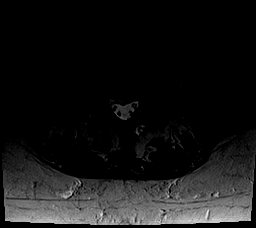
[im 16/47]
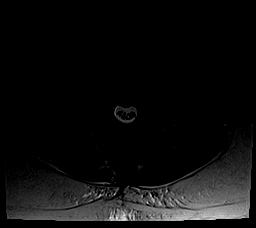
[im 22/47]
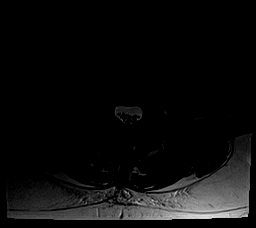
[im 25/47]
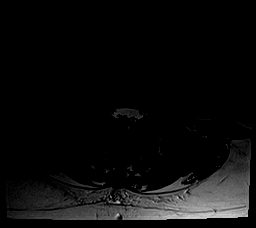
[im 28/47]
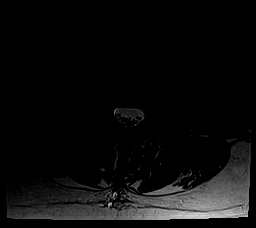
[im 34/47]
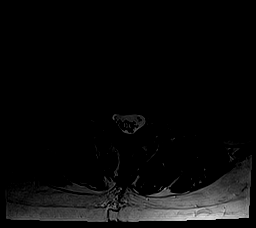
[im 40/47]
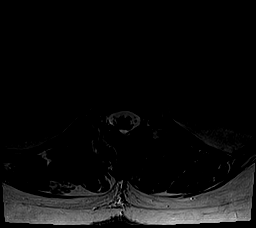
[im 47/47]
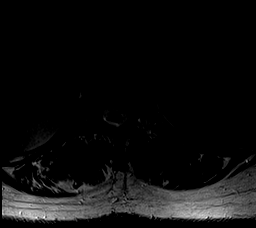

[26 of 48 positions shown; findings below may reference images not displayed]

FINDINGS: Segmentation:  Standard.

Alignment: Straightening of the normal lumbar lordosis. L5-S1
retrolisthesis of approximately 4 mm. 3 mm anterolisthesis L3-4. 2
mm retrolisthesis L4-5. No visible pars defects.

Vertebrae: No worrisome osseous lesions. Modic type 1 changes above
and below L4-5.

Conus medullaris and cauda equina: Conus extends to the L1 level.
Conus and cauda equina appear normal.

Paraspinal and other soft tissues: Unremarkable.

Disc levels:

L1-L2: Disc desiccation and disc space narrowing. Central and
rightward extrusion, slight cephalad migrated free fragment. Facet
arthropathy. RIGHT L2 neural impingement is possible

L2-L3: Disc space narrowing. Annular bulge. Facet arthropathy. No
definite impingement.

L3-L4: 3 mm anterolisthesis. Annular bulge. Central and rightward
protrusion extends to the foramen. Posterior element hypertrophy
affecting facets ligamentum flavum. Moderate stenosis. RIGHT greater
than LEFT L3 and L4 neural impingement.

L4-L5: 2 mm retrolisthesis. Disc space narrowing. Central disc
extrusion with caudally migrated fragment. Posterior element
hypertrophy affecting facets/ligamentum flavum. Critical stenosis.
Significant BILATERAL L4 and L5 neural impingement.

L5-S1: Disc space narrowing. 4 mm retrolisthesis. Central
protrusion/extrusion, with disc material extending into both
foramina. Posterior element hypertrophy. Mild stenosis. Borderline
subarticular zone narrowing. Moderate foraminal narrowing,
particularly on the LEFT, with severe LEFT L5 nerve root
compression.

Compared with 1005, there is progression of disease at all levels,
particularly L4-5 and L5-S1.
IMPRESSION: Critical stenosis at L4-5. Central disc extrusion with caudally
migrated fragment and posterior element hypertrophy. Significant
BILATERAL L4 and L5 neural impingement.

Mild stenosis L5-S1, with 4 mm retrolisthesis, posterior element
hypertrophy, and central protrusion/extrusion, resulting in severe
LEFT and moderate RIGHT foraminal narrowing.

Moderate stenosis at L3-4, related to slip, posterior element
hypertrophy, and protrusion with RIGHT greater than LEFT L3 and L4
neural impingement.

With regard to the patient's foot drop, both the L4-5 and L5-S1
levels may be contributory. Consider upright lateral flexion
extension lumbar radiographs to evaluate for dynamic instability.

## 2020-01-07 ENCOUNTER — Ambulatory Visit (INDEPENDENT_AMBULATORY_CARE_PROVIDER_SITE_OTHER): Payer: BC Managed Care – PPO

## 2020-01-07 ENCOUNTER — Encounter: Payer: Self-pay | Admitting: Podiatry

## 2020-01-07 ENCOUNTER — Ambulatory Visit: Payer: BC Managed Care – PPO | Admitting: Podiatry

## 2020-01-07 ENCOUNTER — Other Ambulatory Visit: Payer: Self-pay | Admitting: Podiatry

## 2020-01-07 ENCOUNTER — Other Ambulatory Visit: Payer: Self-pay

## 2020-01-07 DIAGNOSIS — M79672 Pain in left foot: Secondary | ICD-10-CM

## 2020-01-07 DIAGNOSIS — M2042 Other hammer toe(s) (acquired), left foot: Secondary | ICD-10-CM

## 2020-01-07 DIAGNOSIS — M779 Enthesopathy, unspecified: Secondary | ICD-10-CM

## 2020-01-07 DIAGNOSIS — M778 Other enthesopathies, not elsewhere classified: Secondary | ICD-10-CM | POA: Diagnosis not present

## 2020-01-07 DIAGNOSIS — L989 Disorder of the skin and subcutaneous tissue, unspecified: Secondary | ICD-10-CM

## 2020-01-12 NOTE — Progress Notes (Signed)
Subjective:   Patient ID: Barbara Michael, female   DOB: 59 y.o.   MRN: [id number]   HPI Patient presents stating she has a lot of pain on top of her fourth toe left foot and its been going on for months and patient has burning pain when walking and wearing shoe gear.  Patient does not smoke likes to be active and states it makes it hard for her to do this with the pain   ROS      Objective:  Physical Exam Vitals and nursing note reviewed.  Constitutional:      Appearance: She is well-developed.  Pulmonary:     Effort: Pulmonary effort is normal.  Musculoskeletal:        General: Normal range of motion.  Skin:    General: Skin is warm.  Neurological:     Mental Status: She is alert.     Neurovascular status intact muscle strength was found to be adequate range of motion within normal limits with patient noted to have inflammation keratotic lesion formation dorsal fourth toe left with fluid buildup around the head of the proximal phalanx.  It is localized to this area with mild lifting of the toe noted.  Patient has good digital perfusion and is well oriented x3     Assessment:  Inflammatory capsulitis fourth digit left with lesion formation and pain     Plan:  H&P conditions reviewed and explained and I did discuss possibility for long-term arthroplasty.  I did at this point did proximal nerve block I did sterile prep of the area injected the inner phalangeal joint 2 mg dexamethasone 3 mg Xylocaine debrided lesion applied padding and hopefully this will allow [place] to prevent surgery  X-rays indicate that there may be some elevation of the fourth toe left and may require surgical intervention at one point future

## 2020-01-16 ENCOUNTER — Ambulatory Visit: Payer: BC Managed Care – PPO | Attending: Internal Medicine

## 2020-01-16 DIAGNOSIS — Z23 Encounter for immunization: Secondary | ICD-10-CM | POA: Insufficient documentation

## 2020-01-16 NOTE — Progress Notes (Signed)
   Covid-19 Vaccination Clinic  Name:  Barbara Michael    MRN: [id number] DOB: [birth year]  01/16/2020  Barbara Michael was observed post Covid-19 immunization for 15 minutes without incident. She was provided with Vaccine Information Sheet and instruction to access the V-Safe system.   Barbara Michael was instructed to call 911 with any severe reactions post vaccine: Marland Kitchen Difficulty breathing  . Swelling of face and throat  . A fast heartbeat  . A bad rash all over body  . Dizziness and weakness   Immunizations Administered    Name Date Dose VIS Date Route   Pfizer COVID-19 Vaccine 01/16/2020  2:41 PM 0.3 mL 10/22/2019 Intramuscular   Manufacturer: [employer], [employer]   Lot: [id number]   NDC: [address]-1

## 2020-01-27 ENCOUNTER — Encounter: Payer: Self-pay | Admitting: Podiatry

## 2020-02-06 ENCOUNTER — Ambulatory Visit: Payer: Self-pay | Attending: Internal Medicine

## 2020-02-06 DIAGNOSIS — Z23 Encounter for immunization: Secondary | ICD-10-CM

## 2020-02-06 NOTE — Progress Notes (Signed)
   Covid-19 Vaccination Clinic  Name:  Barbara Michael    MRN: [id number] DOB: 1961/10/26  02/06/2020  [name] was observed post Covid-19 immunization for 15 minutes without incident. She was provided with Vaccine Information Sheet and instruction to access the V-Safe system.   [name] was instructed to call 911 with any severe reactions post vaccine: Marland Kitchen Difficulty breathing  . Swelling of face and throat  . A fast heartbeat  . A bad rash all over body  . Dizziness and weakness   Immunizations Administered    Name Date Dose VIS Date Route   Pfizer COVID-19 Vaccine 02/06/2020  8:40 AM 0.3 mL 10/22/2019 Intramuscular   Manufacturer: [employer], [employer]   Lot: [id number]   NDC: [address]-3

## 2020-03-19 ENCOUNTER — Other Ambulatory Visit: Payer: Self-pay | Admitting: Dermatology

## 2020-03-20 NOTE — Telephone Encounter (Signed)
Patient last seen in 2019. Any refills or no?

## 2020-04-06 ENCOUNTER — Other Ambulatory Visit: Payer: Self-pay

## 2020-04-06 ENCOUNTER — Ambulatory Visit: Payer: BC Managed Care – PPO | Admitting: Podiatry

## 2020-04-06 ENCOUNTER — Encounter: Payer: Self-pay | Admitting: Podiatry

## 2020-04-06 VITALS — Temp 98.6°F

## 2020-04-06 DIAGNOSIS — M7672 Peroneal tendinitis, left leg: Secondary | ICD-10-CM | POA: Diagnosis not present

## 2020-04-06 DIAGNOSIS — L600 Ingrowing nail: Secondary | ICD-10-CM

## 2020-04-07 NOTE — Progress Notes (Signed)
Subjective:   Patient ID: Barbara Michael, female   DOB: 59 y.o.   MRN: [id number]   HPI Patient presents with discoloration damage to the left big toenail and concerned about looseness of it and also has inflammation pain of the outside of the left foot with fluid buildup that is painful when palpated.  Patient is concerned about whether the nail can be saved but it does not currently hurt   ROS      Objective:  Physical Exam  Inflammatory peroneal tendinitis left and nail damage with probable ingrown component left hallux     Assessment:  I noted there to be quite a bit bit of pain at the left insertional point of the peroneal tendon and I noted yellow discoloration hallux nail left with mild looseness     Plan:  H&P reviewed conditions.  For the nail I do recommend topical medications continuing to bind the nail soaks and if it were to get sore or further looseness it may need to be removed permanently.  For the peroneal I did do sterile prep and injected the tendon at its base 3 mg Dexasone Kenalog 5 mg Xylocaine and advised on reduced activity

## 2020-07-05 LAB — COLOGUARD: COLOGUARD: NEGATIVE

## 2020-09-13 ENCOUNTER — Ambulatory Visit: Payer: BC Managed Care – PPO | Admitting: Physician Assistant

## 2020-10-30 ENCOUNTER — Ambulatory Visit (INDEPENDENT_AMBULATORY_CARE_PROVIDER_SITE_OTHER): Payer: BC Managed Care – PPO

## 2020-10-30 ENCOUNTER — Other Ambulatory Visit: Payer: Self-pay

## 2020-10-30 DIAGNOSIS — Z23 Encounter for immunization: Secondary | ICD-10-CM

## 2020-10-31 ENCOUNTER — Ambulatory Visit: Payer: BC Managed Care – PPO

## 2020-12-08 ENCOUNTER — Ambulatory Visit: Payer: BC Managed Care – PPO | Admitting: Physician Assistant

## 2021-06-13 ENCOUNTER — Other Ambulatory Visit (HOSPITAL_COMMUNITY): Payer: Self-pay | Admitting: Internal Medicine

## 2021-06-13 DIAGNOSIS — R1010 Upper abdominal pain, unspecified: Secondary | ICD-10-CM

## 2021-06-20 ENCOUNTER — Ambulatory Visit (HOSPITAL_COMMUNITY)
Admission: RE | Admit: 2021-06-20 | Discharge: 2021-06-20 | Disposition: A | Discharge status: Home / Self Care | Payer: BC Managed Care – PPO | Source: Ambulatory Visit | Attending: Internal Medicine | Admitting: Internal Medicine

## 2021-06-20 ENCOUNTER — Other Ambulatory Visit: Payer: Self-pay

## 2021-06-20 DIAGNOSIS — R1010 Upper abdominal pain, unspecified: Secondary | ICD-10-CM | POA: Insufficient documentation

## 2022-01-10 ENCOUNTER — Ambulatory Visit: Payer: BC Managed Care – PPO | Admitting: Podiatry

## 2022-01-21 ENCOUNTER — Other Ambulatory Visit: Payer: Self-pay

## 2022-01-21 ENCOUNTER — Ambulatory Visit (INDEPENDENT_AMBULATORY_CARE_PROVIDER_SITE_OTHER): Payer: BC Managed Care – PPO

## 2022-01-21 ENCOUNTER — Ambulatory Visit: Payer: BC Managed Care – PPO | Admitting: Podiatry

## 2022-01-21 DIAGNOSIS — M7671 Peroneal tendinitis, right leg: Secondary | ICD-10-CM

## 2022-01-21 DIAGNOSIS — M7672 Peroneal tendinitis, left leg: Secondary | ICD-10-CM | POA: Diagnosis not present

## 2022-01-21 DIAGNOSIS — M79672 Pain in left foot: Secondary | ICD-10-CM

## 2022-01-21 DIAGNOSIS — M79671 Pain in right foot: Secondary | ICD-10-CM

## 2022-01-21 MED ORDER — DICLOFENAC SODIUM 75 MG PO TBEC: 75.0000 mg | DELAYED_RELEASE_TABLET | Freq: Two times a day (BID) | ORAL | 2 refills | Status: DC

## 2022-01-21 NOTE — Patient Instructions (Signed)
Plantar Fasciitis (Heel Spur Syndrome) °with Rehab °The plantar fascia is a fibrous, ligament-like, soft-tissue structure that spans the bottom of the foot. Plantar fasciitis is a condition that causes pain in the foot due to inflammation of the tissue. °SYMPTOMS  °Pain and tenderness on the underneath side of the foot. °Pain that worsens with standing or walking. °CAUSES  °Plantar fasciitis is caused by irritation and injury to the plantar fascia on the underneath side of the foot. Common mechanisms of injury include: °Direct trauma to bottom of the foot. °Damage to a small nerve that runs under the foot where the main fascia attaches to the heel bone. °Stress placed on the plantar fascia due to bone spurs. °RISK INCREASES WITH:  °Activities that place stress on the plantar fascia (running, jumping, pivoting, or cutting). °Poor strength and flexibility. °Improperly fitted shoes. °Tight calf muscles. °Flat feet. °Failure to warm-up properly before activity. °Obesity. °PREVENTION °Warm up and stretch properly before activity. °Allow for adequate recovery between workouts. °Maintain physical fitness: °Strength, flexibility, and endurance. °Cardiovascular fitness. °Maintain a health body weight. °Avoid stress on the plantar fascia. °Wear properly fitted shoes, including arch supports for individuals who have flat feet. ° °PROGNOSIS  °If treated properly, then the symptoms of plantar fasciitis usually resolve without surgery. However, occasionally surgery is necessary. ° °RELATED COMPLICATIONS  °Recurrent symptoms that may result in a chronic condition. °Problems of the lower back that are caused by compensating for the injury, such as limping. °Pain or weakness of the foot during push-off following surgery. °Chronic inflammation, scarring, and partial or complete fascia tear, occurring more often from repeated injections. ° °TREATMENT  °Treatment initially involves the use of ice and medication to help reduce pain and  inflammation. The use of strengthening and stretching exercises may help reduce pain with activity, especially stretches of the Achilles tendon. These exercises may be performed at home or with a therapist. Your caregiver may recommend that you use heel cups of arch supports to help reduce stress on the plantar fascia. Occasionally, corticosteroid injections are given to reduce inflammation. If symptoms persist for greater than 6 months despite non-surgical (conservative), then surgery may be recommended.  ° °MEDICATION  °If pain medication is necessary, then nonsteroidal anti-inflammatory medications, such as aspirin and ibuprofen, or other minor pain relievers, such as acetaminophen, are often recommended. °Do not take pain medication within 7 days before surgery. °Prescription pain relievers may be given if deemed necessary by your caregiver. Use only as directed and only as much as you need. °Corticosteroid injections may be given by your caregiver. These injections should be reserved for the most serious cases, because they may only be given a certain number of times. ° °HEAT AND COLD °Cold treatment (icing) relieves pain and reduces inflammation. Cold treatment should be applied for 10 to 15 minutes every 2 to 3 hours for inflammation and pain and immediately after any activity that aggravates your symptoms. Use ice packs or massage the area with a piece of ice (ice massage). °Heat treatment may be used prior to performing the stretching and strengthening activities prescribed by your caregiver, physical therapist, or athletic trainer. Use a heat pack or soak the injury in warm water. ° °SEEK IMMEDIATE MEDICAL CARE IF: °Treatment seems to offer no benefit, or the condition worsens. °Any medications produce adverse side effects. ° °EXERCISES- °RANGE OF MOTION (ROM) AND STRETCHING EXERCISES - Plantar Fasciitis (Heel Spur Syndrome) °These exercises may help you when beginning to rehabilitate your injury. Your    symptoms may resolve with or without further involvement from your physician, physical therapist or athletic trainer. While completing these exercises, remember:  °Restoring tissue flexibility helps normal motion to return to the joints. This allows healthier, less painful movement and activity. °An effective stretch should be held for at least 30 seconds. °A stretch should never be painful. You should only feel a gentle lengthening or release in the stretched tissue. ° °RANGE OF MOTION - Toe Extension, Flexion °Sit with your right / left leg crossed over your opposite knee. °Grasp your toes and gently pull them back toward the top of your foot. You should feel a stretch on the bottom of your toes and/or foot. °Hold this stretch for 10 seconds. °Now, gently pull your toes toward the bottom of your foot. You should feel a stretch on the top of your toes and or foot. °Hold this stretch for 10 seconds. °Repeat  times. Complete this stretch 3 times per day.  ° °RANGE OF MOTION - Ankle Dorsiflexion, Active Assisted °Remove shoes and sit on a chair that is preferably not on a carpeted surface. °Place right / left foot under knee. Extend your opposite leg for support. °Keeping your heel down, slide your right / left foot back toward the chair until you feel a stretch at your ankle or calf. If you do not feel a stretch, slide your bottom forward to the edge of the chair, while still keeping your heel down. °Hold this stretch for 10 seconds. °Repeat 3 times. Complete this stretch 2 times per day.  ° °STRETCH  Gastroc, Standing °Place hands on wall. °Extend right / left leg, keeping the front knee somewhat bent. °Slightly point your toes inward on your back foot. °Keeping your right / left heel on the floor and your knee straight, shift your weight toward the wall, not allowing your back to arch. °You should feel a gentle stretch in the right / left calf. Hold this position for 10 seconds. °Repeat 3 times. Complete this  stretch 2 times per day. ° °STRETCH  Soleus, Standing °Place hands on wall. °Extend right / left leg, keeping the other knee somewhat bent. °Slightly point your toes inward on your back foot. °Keep your right / left heel on the floor, bend your back knee, and slightly shift your weight over the back leg so that you feel a gentle stretch deep in your back calf. °Hold this position for 10 seconds. °Repeat 3 times. Complete this stretch 2 times per day. ° °STRETCH  Gastrocsoleus, Standing  °Note: This exercise can place a lot of stress on your foot and ankle. Please complete this exercise only if specifically instructed by your caregiver.  °Place the ball of your right / left foot on a step, keeping your other foot firmly on the same step. °Hold on to the wall or a rail for balance. °Slowly lift your other foot, allowing your body weight to press your heel down over the edge of the step. °You should feel a stretch in your right / left calf. °Hold this position for 10 seconds. °Repeat this exercise with a slight bend in your right / left knee. °Repeat 3 times. Complete this stretch 2 times per day.  ° °STRENGTHENING EXERCISES - Plantar Fasciitis (Heel Spur Syndrome)  °These exercises may help you when beginning to rehabilitate your injury. They may resolve your symptoms with or without further involvement from your physician, physical therapist or athletic trainer. While completing these exercises, remember:  °Muscles can   gain both the endurance and the strength needed for everyday activities through controlled exercises. °Complete these exercises as instructed by your physician, physical therapist or athletic trainer. Progress the resistance and repetitions only as guided. ° °STRENGTH - Towel Curls °Sit in a chair positioned on a non-carpeted surface. °Place your foot on a towel, keeping your heel on the floor. °Pull the towel toward your heel by only curling your toes. Keep your heel on the floor. °Repeat 3 times.  Complete this exercise 2 times per day. ° °STRENGTH - Ankle Inversion °Secure one end of a rubber exercise band/tubing to a fixed object (table, pole). Loop the other end around your foot just before your toes. °Place your fists between your knees. This will focus your strengthening at your ankle. °Slowly, pull your big toe up and in, making sure the band/tubing is positioned to resist the entire motion. °Hold this position for 10 seconds. °Have your muscles resist the band/tubing as it slowly pulls your foot back to the starting position. °Repeat 3 times. Complete this exercises 2 times per day.  °Document Released: 10/28/2005 Document Revised: 01/20/2012 Document Reviewed: 02/09/2009 °[employer]® Patient Information ©2014 [employer].  °

## 2022-01-23 DIAGNOSIS — M7672 Peroneal tendinitis, left leg: Secondary | ICD-10-CM | POA: Diagnosis not present

## 2022-01-23 MED ORDER — TRIAMCINOLONE ACETONIDE 10 MG/ML IJ SUSP
10.0000 mg | Freq: Once | INTRAMUSCULAR | Status: AC
  Administered 2022-01-23: 10 mg

## 2022-01-23 NOTE — Progress Notes (Signed)
Subjective:  ? ?Patient ID: Minta Balsam, female   DOB: 61 y.o.   MRN: [id number]  ? ?HPI ?Patient presents stating she is having a lot of pain in the outside of her left foot and its been very sore making it hard to walk with pain right also but not to the same degree ? ? ?ROS ? ? ?   ?Objective:  ?Physical Exam  ?Neurovascular status intact inflammation pain base of fifth metatarsal left with fluid buildup around the insertion of the peroneal tendon and right also ? ?   ?Assessment:  ?Peroneal tendinitis left inflammation with pain also noted right foot not to the same degree ? ?   ?Plan:  ?H&P reviewed condition sterile prep injected the outside of the fifth metatarsal base 3 mg dexamethasone Kenalog 5 mg Xylocaine advised on ice therapy reappoint to recheck ? ?X-rays did not indicate spurring or any indications of bony pathology appears to be soft tissue inflammation around the base of the fifth metatarsal left over right ?   ? ? ?

## 2022-01-25 ENCOUNTER — Other Ambulatory Visit: Payer: Self-pay | Admitting: Podiatry

## 2022-01-25 DIAGNOSIS — M7671 Peroneal tendinitis, right leg: Secondary | ICD-10-CM

## 2022-01-25 DIAGNOSIS — M7672 Peroneal tendinitis, left leg: Secondary | ICD-10-CM

## 2022-04-05 ENCOUNTER — Other Ambulatory Visit: Payer: Self-pay | Admitting: Podiatry

## 2022-05-17 IMAGING — US US ABDOMEN LIMITED
1 series · 14 of 25 positions shown · non-contrast
Comparison: None.

CLINICAL DATA: Upper abdominal pain

EXAM:
ULTRASOUND ABDOMEN LIMITED RIGHT UPPER QUADRANT

[Series 1: us abdomen limited ruq (liver/gb) · 14 of 80 slices shown]
[im 1/80]
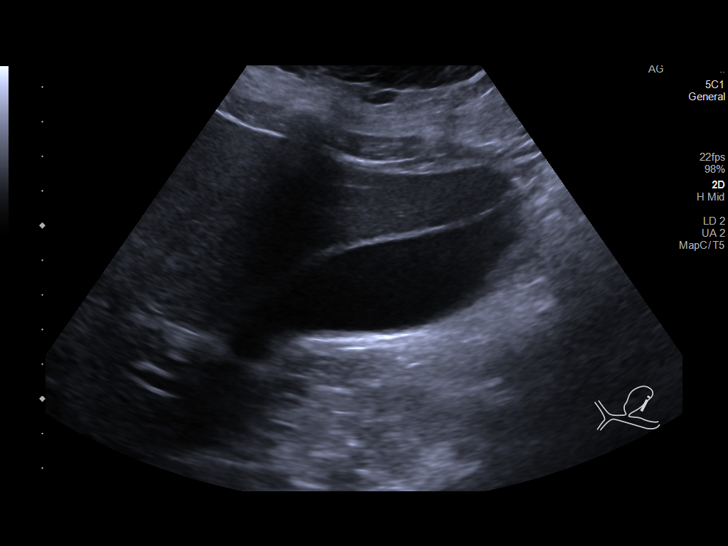
[im 7/80]
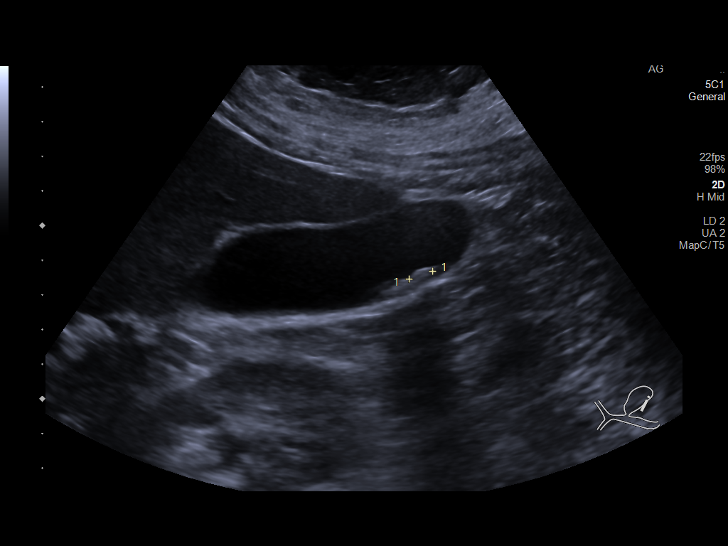
[im 14/80]
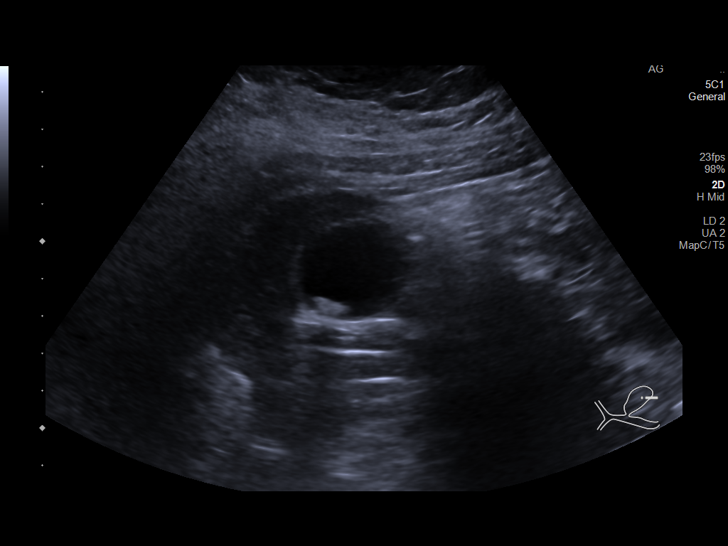
[im 20/80]
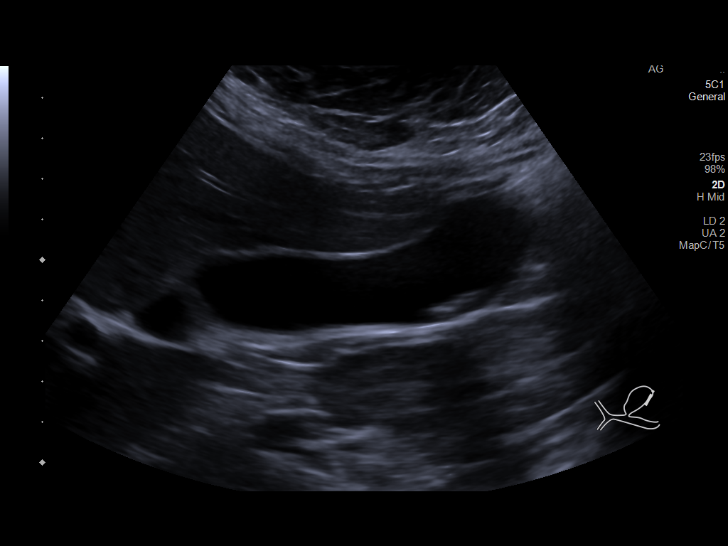
[im 27/80]
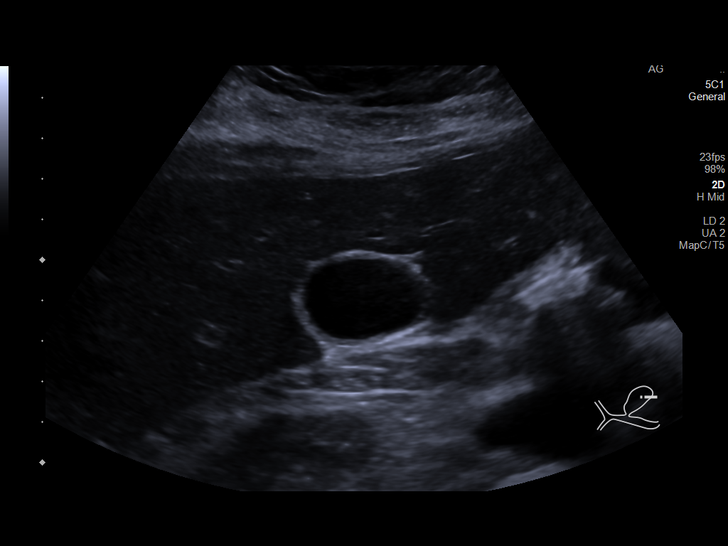
[im 30/80]
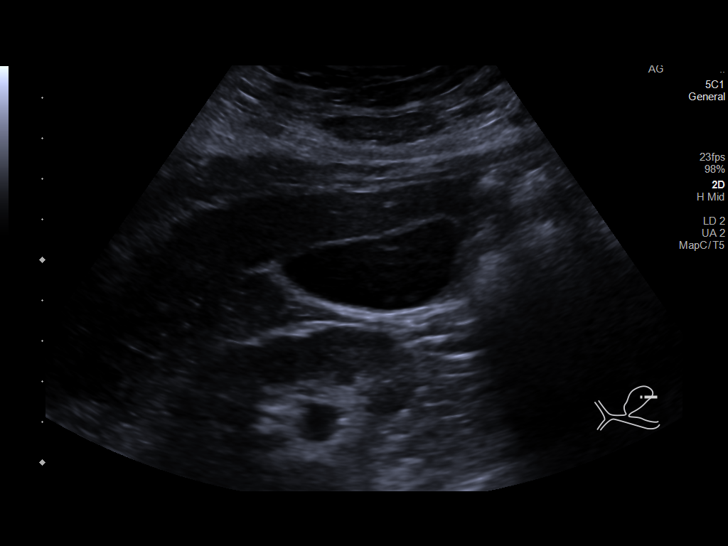
[im 37/80]
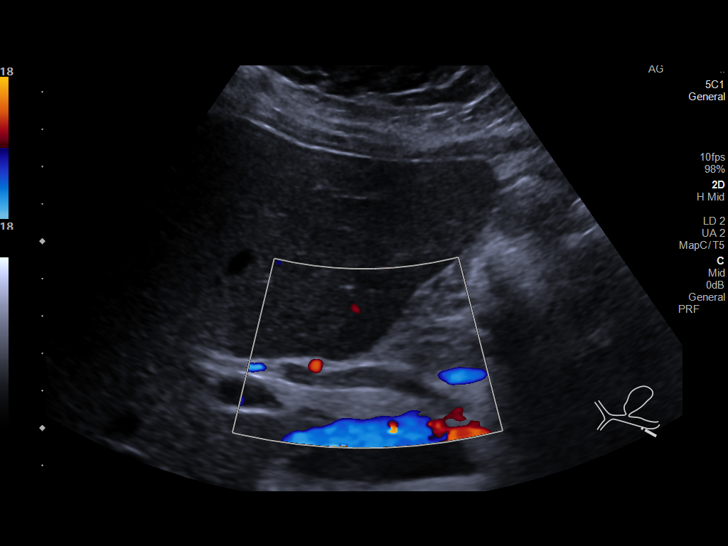
[im 43/80]
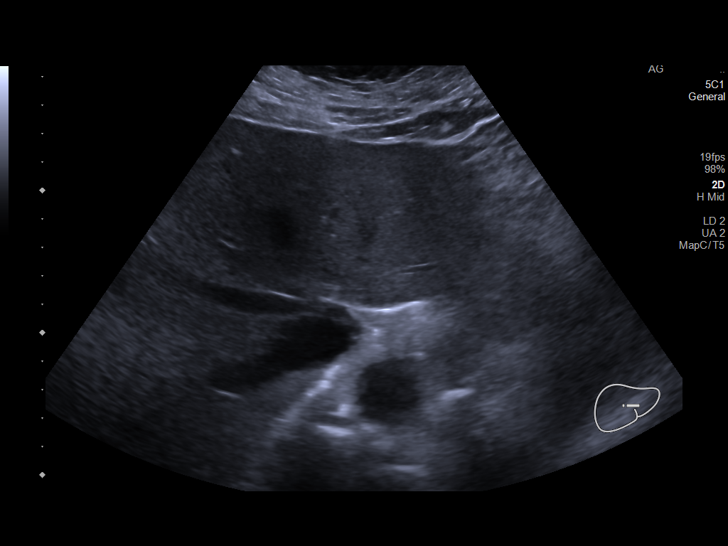
[im 50/80]
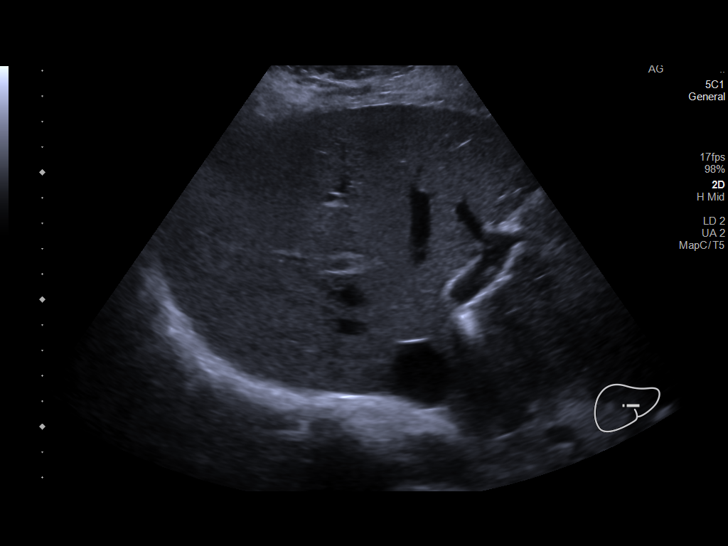
[im 53/80]
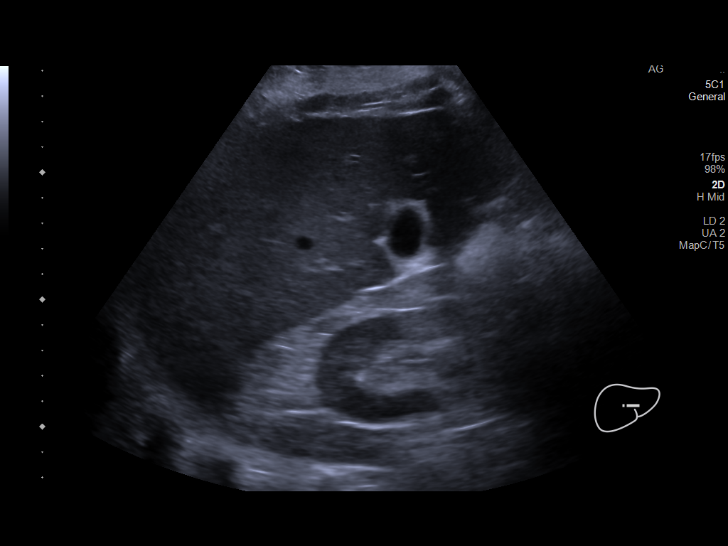
[im 60/80]
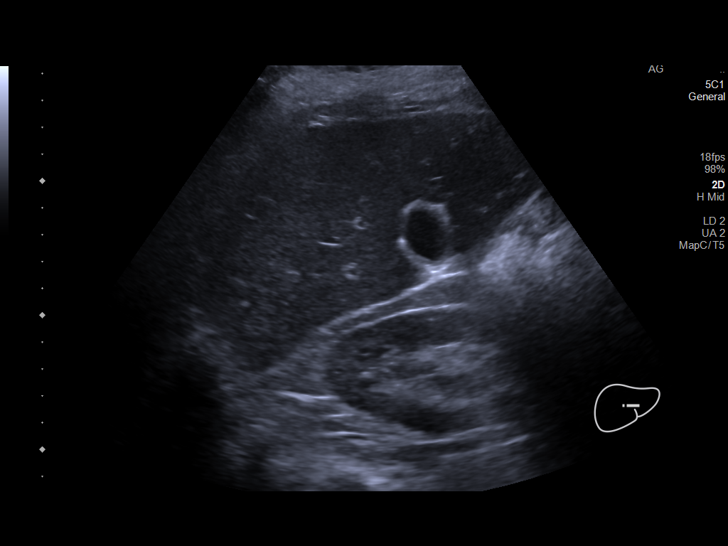
[im 66/80]
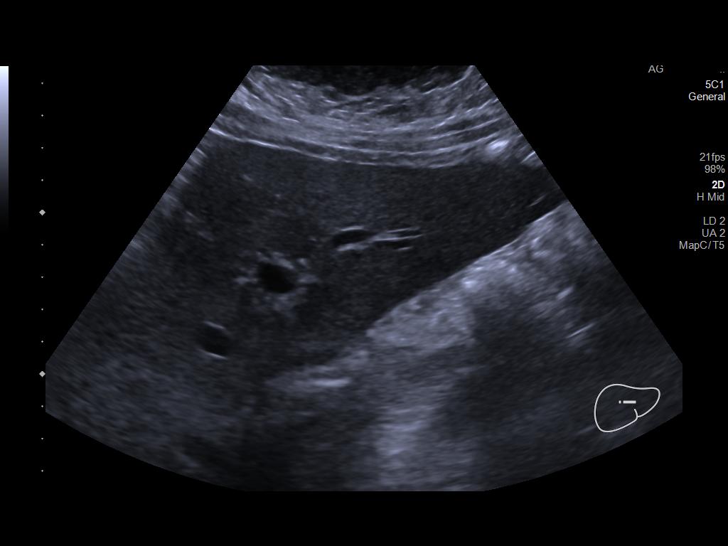
[im 73/80]
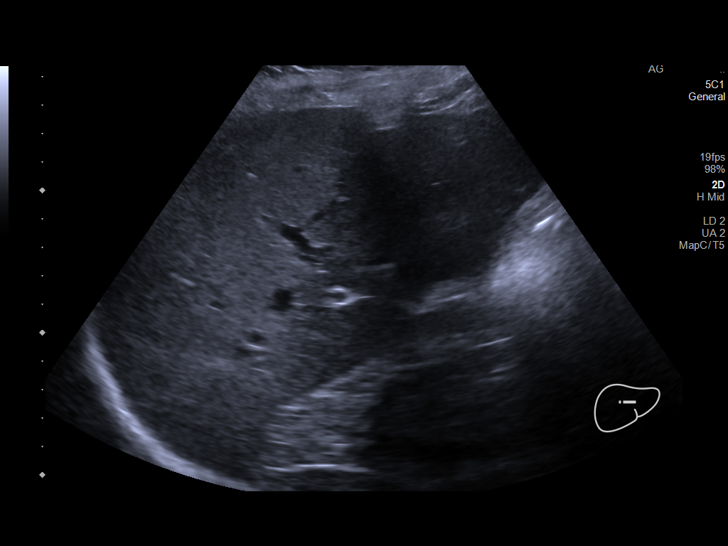
[im 80/80]
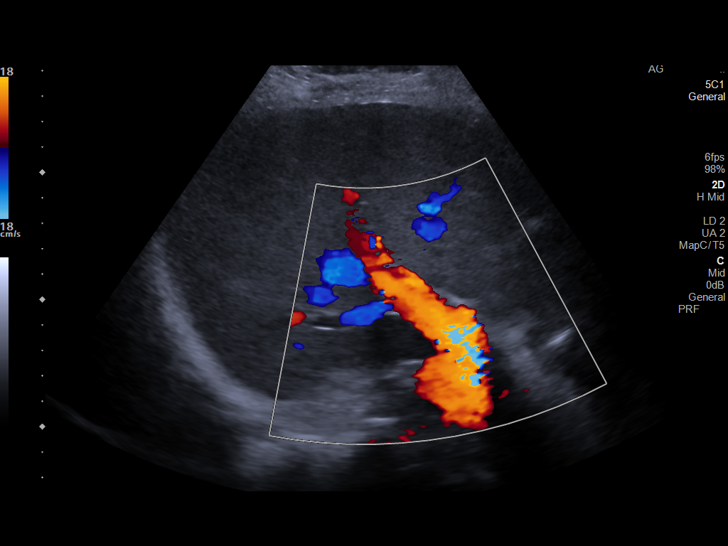

[14 of 25 positions shown; findings below may reference images not displayed]

FINDINGS: Gallbladder:

Gallstones up to 7 mm. No wall thickening or pericholecystic fluid.
Sonographic Murphy's sign was not elicited.

Common bile duct:

Diameter: Normal, 5 mm.

Liver:

No focal lesion identified. Within normal limits in parenchymal
echogenicity. Portal vein is patent on color Doppler imaging with
normal direction of blood flow towards the liver.

Other: None.
IMPRESSION: Cholelithiasis without acute cholecystitis or biliary duct
dilatation.

## 2022-06-11 ENCOUNTER — Ambulatory Visit: Payer: BC Managed Care – PPO | Admitting: Physician Assistant

## 2022-07-31 ENCOUNTER — Ambulatory Visit: Payer: BC Managed Care – PPO | Admitting: Podiatry

## 2023-02-19 ENCOUNTER — Ambulatory Visit: Payer: BC Managed Care – PPO | Admitting: Podiatry

## 2023-02-19 DIAGNOSIS — M7672 Peroneal tendinitis, left leg: Secondary | ICD-10-CM | POA: Diagnosis not present

## 2023-02-19 DIAGNOSIS — M7751 Other enthesopathy of right foot: Secondary | ICD-10-CM

## 2023-02-19 MED ORDER — TRIAMCINOLONE ACETONIDE 10 MG/ML IJ SUSP
20.0000 mg | Freq: Once | INTRAMUSCULAR | Status: AC
  Administered 2023-02-19: 20 mg

## 2023-02-19 NOTE — Progress Notes (Signed)
Subjective:   Patient ID: Barbara Michael, female   DOB: 62 y.o.   MRN: [id number]   HPI Patient presents stating she is having a lot of pain in the outside of her left foot and in the fifth metatarsal head right with inflammation with lesion formation bilateral and is getting ready to go on an [place] cruise in June   ROS      Objective:  Physical Exam  Neurovascular status intact with inflammation of the peroneal insertion left with fluid buildup and inflammation around the fifth metatarsal head plantar right with lesion formation bilateral     Assessment:  Inflammatory capsulitis of the fifth MPJ right and peroneal tendinitis left     Plan:  H&P reviewed careful injection of the subcapsular area right 3 mg Dexasone Kenalog 5 mg Xylocaine and did injection of the peroneal tendon at insertion left 3 mg dexamethasone Kenalog 5 mg Xylocaine advised on ice debrided lesions courtesy reappoint as needed

## 2023-05-13 ENCOUNTER — Encounter: Payer: Self-pay | Admitting: Podiatry

## 2023-05-13 DIAGNOSIS — Z0000 Encounter for general adult medical examination without abnormal findings: Secondary | ICD-10-CM

## 2023-09-28 ENCOUNTER — Telehealth: Payer: BC Managed Care – PPO | Admitting: Family Medicine

## 2023-09-28 DIAGNOSIS — N3000 Acute cystitis without hematuria: Secondary | ICD-10-CM | POA: Diagnosis not present

## 2023-09-28 MED ORDER — NITROFURANTOIN MONOHYD MACRO 100 MG PO CAPS: 100.0000 mg | ORAL_CAPSULE | Freq: Two times a day (BID) | ORAL | 0 refills | Status: AC

## 2023-09-28 NOTE — Progress Notes (Signed)
E-Visit for Urinary Problems  We are sorry that you are not feeling well.  Here is how we plan to help!  Based on what you shared with me it looks like you most likely have a simple urinary tract infection.  A UTI (Urinary Tract Infection) is a bacterial infection of the bladder.  Most cases of urinary tract infections are simple to treat but a key part of your care is to encourage you to drink plenty of fluids and watch your symptoms carefully.  I have prescribed MacroBid 100 mg twice a day for 5 days.  Your symptoms should gradually improve. Call us if the burning in your urine worsens, you develop worsening fever, back pain or pelvic pain or if your symptoms do not resolve after completing the antibiotic.  Urinary tract infections can be prevented by drinking plenty of water to keep your body hydrated.  Also be sure when you wipe, wipe from front to back and don't hold it in!  If possible, empty your bladder every 4 hours.  HOME CARE Drink plenty of fluids Compete the full course of the antibiotics even if the symptoms resolve Remember, when you need to go.go. Holding in your urine can increase the likelihood of getting a UTI! GET HELP RIGHT AWAY IF: You cannot urinate You get a high fever Worsening back pain occurs You see blood in your urine You feel sick to your stomach or throw up You feel like you are going to pass out  MAKE SURE YOU  Understand these instructions. Will watch your condition. Will get help right away if you are not doing well or get worse.   Thank you for choosing an e-visit.  Your e-visit answers were reviewed by a board certified advanced clinical practitioner to complete your personal care plan. Depending upon the condition, your plan could have included both over the counter or prescription medications.  Please review your pharmacy choice. Make sure the pharmacy is open so you can pick up prescription now. If there is a problem, you may contact your  provider through MyChart messaging and have the prescription routed to another pharmacy.  Your safety is important to us. If you have drug allergies check your prescription carefully.   For the next 24 hours you can use MyChart to ask questions about today's visit, request a non-urgent call back, or ask for a work or school excuse. You will get an email in the next two days asking about your experience. I hope that your e-visit has been valuable and will speed your recovery.    have provided 5 minutes of non face to face time during this encounter for chart review and documentation.   

## 2023-11-07 ENCOUNTER — Telehealth: Payer: BC Managed Care – PPO | Admitting: Physician Assistant

## 2023-11-07 DIAGNOSIS — R3989 Other symptoms and signs involving the genitourinary system: Secondary | ICD-10-CM

## 2023-11-08 NOTE — Progress Notes (Signed)
Because of recurrent symptoms and previous evisit for the same issue, I feel your condition warrants further evaluation and I recommend that you be seen in a face to face visit.   NOTE: There will be NO CHARGE for this eVisit   If you are having a true medical emergency please call 911.      For an urgent face to face visit, [place] has eight urgent care centers for your convenience:   NEW!! [facility] Health Urgent Care Center at [facility] Get Driving Directions [phone] [address] [place], [address]    [facility] Health Urgent Care Center at [facility] Get Driving Directions [phone] [address] [place], [place] [address]   [facility] Health Urgent Care Center [facility]) Get Driving Directions [phone] [address] [place], [place] [address]  [facility] Health Urgent Care Center [facility]) Get Driving Directions [phone] [address] [place],  [place]  [address]  [facility] Health Urgent Care Center [facility] - at [employer]  [phone] [address] [place],  [place] [address]   [facility] Health Urgent Care at [facility] Get Driving Directions [phone] [place], [place] [address]   [facility] Health Urgent Care at [facility] Get Driving Directions  [phone] [address] [place], [place] [address]   [facility] at [facility] Directions [phone] [address], [address] [place], [place] [address]  Your MyChart E-visit questionnaire answers were reviewed by a board certified advanced clinical practitioner to complete your personal care plan based on your specific symptoms.  Thank you for using e-Visits.

## 2023-11-12 HISTORY — PX: CARPAL TUNNEL RELEASE: SHX101

## 2024-02-05 ENCOUNTER — Ambulatory Visit: Payer: Self-pay | Admitting: Podiatry

## 2024-02-05 ENCOUNTER — Encounter: Payer: Self-pay | Admitting: Podiatry

## 2024-02-05 DIAGNOSIS — M7752 Other enthesopathy of left foot: Secondary | ICD-10-CM | POA: Diagnosis not present

## 2024-02-05 MED ORDER — TRIAMCINOLONE ACETONIDE 10 MG/ML IJ SUSP: 10.0000 mg | Freq: Once | INTRAMUSCULAR | Status: AC

## 2024-02-05 NOTE — Progress Notes (Signed)
 Subjective:   Patient ID: Barbara Michael, female   DOB: 63 y.o.   MRN: [id number]   HPI Patient presents stating she has to do very sore toes on the left foot with inflammation fluid around the joints.  States this has been getting worse over the last few months and does have significant forefoot structural issue   ROS      Objective:  Physical Exam  Neurovascular status intact inflammation third and fourth digits left fluid buildup around the proximal inner phalangeal joint     Assessment:  Inflammatory capsulitis third and fourth digits left with pain     Plan:  H&P reviewed today I went ahead did sterile prep and I injected the 2 joints 3 mg Dexasone Kenalog 5 mm Xylocaine debrided tissue courtesy and advised on wider shoes.  Reappoint as symptoms indicate

## 2024-05-18 ENCOUNTER — Encounter: Payer: Self-pay | Admitting: Internal Medicine

## 2024-06-04 ENCOUNTER — Ambulatory Visit: Payer: Self-pay | Admitting: Surgery

## 2024-06-04 NOTE — H&P (View-Only) (Signed)
 History of Present Illness: Barbara Michael is a 63 y.o. female who is seen today as an office consultation for evaluation of New Consultation (cholelithiasis w/o cholecystitis)   She has been having intermittent RUQ pain for several years.  She has had 2 episodes of severe pain in the right upper quadrant with radiation around the right flank, most recently last year.  Both episodes occurred after eating a greasy meal.  More recently, she reports more mild upper abdominal discomfort and bloating after eating. She previously had an US  in 2022 showing cholelithiasis.   She has not had any prior abdominal surgeries.  She does not take any blood thinners.  She has sleep apnea and uses a CPAP.     Review of Systems: A complete review of systems was obtained from the patient.  I have reviewed this information and discussed as appropriate with the patient.  See HPI as well for other ROS.     Medical History: Past Medical History Past Medical History: Diagnosis Date  GERD (gastroesophageal reflux disease)    Thyroid disease         Problem List There is no problem list on file for this patient.     Past Surgical History Past Surgical History: Procedure Laterality Date  .Laminectomy      ENDOSCOPIC CARPAL TUNNEL RELEASE      TONSILLECTOMY          Allergies Allergies Allergen Reactions  Cephalexin Other (See Comments), Nausea And Vomiting and Unknown     cephalexin  Codeine Other (See Comments), Nausea and Rash     codeine  Erythromycin Other (See Comments) and Rash  Penicillins Other (See Comments) and Rash  Ciprofloxacin Itching  Sulfa (Sulfonamide Antibiotics) Other (See Comments)  Topiramate Other (See Comments)     topiramate      Medications Ordered Prior to Encounter Current Outpatient Medications on File Prior to Visit Medication Sig Dispense Refill  ARMOUR THYROID 90 mg tablet TAKE 0.5 TABLET BY MOUTH EVERY DAY      ascorbic acid (VITA-C ORAL) Take by mouth       buPROPion (WELLBUTRIN XL) 150 MG XL tablet Take 150 mg by mouth once daily      gabapentin (NEURONTIN) 100 MG capsule Take by mouth every evening      levocetirizine (XYZAL) 5 MG tablet Take 5 mg by mouth once daily      omega-3s/dha/epa/fish oil/D3 (VITAMIN-D + OMEGA-3 ORAL)        omeprazole (PRILOSEC) 20 MG DR capsule Take 20 mg by mouth once daily      progesterone (PROMETRIUM) 200 MG capsule Take 1 capsule 3 times a week by oral route for 30 days.      valACYclovir (VALTREX) 500 MG tablet Take 500 mg by mouth once daily      XANAX 0.5 mg tablet Take 1 tablet as needed by oral route for 2 days.        No current facility-administered medications on file prior to visit.      Family History Family History Problem Relation Age of Onset  Stroke Mother    Obesity Mother    Skin cancer Father    Obesity Father        Tobacco Use History Social History    Tobacco Use Smoking Status Never Smokeless Tobacco Never      Social History Social History    Socioeconomic History  Marital status: Unknown Tobacco Use  Smoking status: Never  Smokeless tobacco: Never Vaping  Use  Vaping status: Never Used Substance and Sexual Activity  Alcohol use: Never  Drug use: Never    Social Drivers of Health    Food Insecurity: Low Risk  (04/28/2023)   Received from [facility]   Hunger Vital Sign    Within the past 12 months, you worried that your food would run out before you got money to buy more: Never true    Within the past 12 months, the food you bought just didn't last and you didn't have money to get more. : Never true   Received from [employer]   Social Network Housing Stability: Unknown (06/04/2024)   Housing Stability Vital Sign    Homeless in the Last Year: No      Objective:   Vitals:   06/04/24 1338 BP: 138/83 Pulse: 108 Temp: 36.8 C (98.3 F) SpO2: 97% Weight: (!) 120.3 kg (265 lb 3.2 oz) Height: 170.2 cm (5' 7) PainSc: 0-No  pain PainLoc: Abdomen   Body mass index is 41.54 kg/m.   Physical Exam Vitals reviewed.  Constitutional:      General: She is not in acute distress.    Appearance: Normal appearance.  HENT:     Head: Normocephalic and atraumatic.  Eyes:     General: No scleral icterus.    Conjunctiva/sclera: Conjunctivae normal.  Pulmonary:     Effort: Pulmonary effort is normal. No respiratory distress.     Breath sounds: Normal breath sounds.  Abdominal:     General: There is no distension.     Palpations: Abdomen is soft.     Tenderness: There is no abdominal tenderness.  Skin:    General: Skin is warm and dry.     Coloration: Skin is not jaundiced.  Neurological:     General: No focal deficit present.     Mental Status: She is alert and oriented to person, place, and time.           Labs, Imaging and Diagnostic Testing: US  abdomen 06/20/21: IMPRESSION: Cholelithiasis without acute cholecystitis or biliary duct dilatation.     Assessment and Plan:   Assessment Diagnoses and all orders for this visit:   Calculus of gallbladder without cholecystitis without obstruction     63 yo female with cholelithiasis and intermittent RUQ pain and bloating. Her symptoms are consistent with biliary colic. Laparoscopic cholecystectomy was recommended. The details of this procedure were discussed with the patient, including the risks of bleeding, infection, bile leak, and <0.5% risk of common bile duct injury. The patient expressed understanding and agrees to proceed with surgery. She will be scheduled for an elective surgery date.  Leonor Dawn, MD [facility] Surgery General, Hepatobiliary and Pancreatic Surgery 06/04/24 1:56 PM

## 2024-06-04 NOTE — H&P (Signed)
 History of Present Illness: Barbara Michael is a 63 y.o. female who is seen today as an office consultation for evaluation of New Consultation (cholelithiasis w/o cholecystitis)   She has been having intermittent RUQ pain for several years.  She has had 2 episodes of severe pain in the right upper quadrant with radiation around the right flank, most recently last year.  Both episodes occurred after eating a greasy meal.  More recently, she reports more mild upper abdominal discomfort and bloating after eating. She previously had an US  in 2022 showing cholelithiasis.   She has not had any prior abdominal surgeries.  She does not take any blood thinners.  She has sleep apnea and uses a CPAP.     Review of Systems: A complete review of systems was obtained from the patient.  I have reviewed this information and discussed as appropriate with the patient.  See HPI as well for other ROS.     Medical History: Past Medical History Past Medical History: Diagnosis Date  GERD (gastroesophageal reflux disease)    Thyroid disease         Problem List There is no problem list on file for this patient.     Past Surgical History Past Surgical History: Procedure Laterality Date  .Laminectomy      ENDOSCOPIC CARPAL TUNNEL RELEASE      TONSILLECTOMY          Allergies Allergies Allergen Reactions  Cephalexin Other (See Comments), Nausea And Vomiting and Unknown     cephalexin  Codeine Other (See Comments), Nausea and Rash     codeine  Erythromycin Other (See Comments) and Rash  Penicillins Other (See Comments) and Rash  Ciprofloxacin Itching  Sulfa (Sulfonamide Antibiotics) Other (See Comments)  Topiramate Other (See Comments)     topiramate      Medications Ordered Prior to Encounter Current Outpatient Medications on File Prior to Visit Medication Sig Dispense Refill  ARMOUR THYROID 90 mg tablet TAKE 0.5 TABLET BY MOUTH EVERY DAY      ascorbic acid (VITA-C ORAL) Take by mouth       buPROPion (WELLBUTRIN XL) 150 MG XL tablet Take 150 mg by mouth once daily      gabapentin (NEURONTIN) 100 MG capsule Take by mouth every evening      levocetirizine (XYZAL) 5 MG tablet Take 5 mg by mouth once daily      omega-3s/dha/epa/fish oil/D3 (VITAMIN-D + OMEGA-3 ORAL)        omeprazole (PRILOSEC) 20 MG DR capsule Take 20 mg by mouth once daily      progesterone (PROMETRIUM) 200 MG capsule Take 1 capsule 3 times a week by oral route for 30 days.      valACYclovir (VALTREX) 500 MG tablet Take 500 mg by mouth once daily      XANAX 0.5 mg tablet Take 1 tablet as needed by oral route for 2 days.        No current facility-administered medications on file prior to visit.      Family History Family History Problem Relation Age of Onset  Stroke Mother    Obesity Mother    Skin cancer Father    Obesity Father        Tobacco Use History Social History    Tobacco Use Smoking Status Never Smokeless Tobacco Never      Social History Social History    Socioeconomic History  Marital status: Unknown Tobacco Use  Smoking status: Never  Smokeless tobacco: Never Vaping  Use  Vaping status: Never Used Substance and Sexual Activity  Alcohol use: Never  Drug use: Never    Social Drivers of Health    Food Insecurity: Low Risk  (04/28/2023)   Received from [facility]   Hunger Vital Sign    Within the past 12 months, you worried that your food would run out before you got money to buy more: Never true    Within the past 12 months, the food you bought just didn't last and you didn't have money to get more. : Never true   Received from [employer]   Social Network Housing Stability: Unknown (06/04/2024)   Housing Stability Vital Sign    Homeless in the Last Year: No      Objective:   Vitals:   06/04/24 1338 BP: 138/83 Pulse: 108 Temp: 36.8 C (98.3 F) SpO2: 97% Weight: (!) 120.3 kg (265 lb 3.2 oz) Height: 170.2 cm (5' 7) PainSc: 0-No  pain PainLoc: Abdomen   Body mass index is 41.54 kg/m.   Physical Exam Vitals reviewed.  Constitutional:      General: She is not in acute distress.    Appearance: Normal appearance.  HENT:     Head: Normocephalic and atraumatic.  Eyes:     General: No scleral icterus.    Conjunctiva/sclera: Conjunctivae normal.  Pulmonary:     Effort: Pulmonary effort is normal. No respiratory distress.     Breath sounds: Normal breath sounds.  Abdominal:     General: There is no distension.     Palpations: Abdomen is soft.     Tenderness: There is no abdominal tenderness.  Skin:    General: Skin is warm and dry.     Coloration: Skin is not jaundiced.  Neurological:     General: No focal deficit present.     Mental Status: She is alert and oriented to person, place, and time.           Labs, Imaging and Diagnostic Testing: US  abdomen 06/20/21: IMPRESSION: Cholelithiasis without acute cholecystitis or biliary duct dilatation.     Assessment and Plan:   Assessment Diagnoses and all orders for this visit:   Calculus of gallbladder without cholecystitis without obstruction     63 yo female with cholelithiasis and intermittent RUQ pain and bloating. Her symptoms are consistent with biliary colic. Laparoscopic cholecystectomy was recommended. The details of this procedure were discussed with the patient, including the risks of bleeding, infection, bile leak, and <0.5% risk of common bile duct injury. The patient expressed understanding and agrees to proceed with surgery. She will be scheduled for an elective surgery date.  Leonor Dawn, MD [facility] Surgery General, Hepatobiliary and Pancreatic Surgery 06/04/24 1:56 PM

## 2024-06-11 HISTORY — PX: COLONOSCOPY: SHX174

## 2024-06-18 NOTE — Pre-Procedure Instructions (Signed)
 Surgical Instructions   Your procedure is scheduled on June 24, 2024. Report to [facility] Main Entrance A at 5:30 A.M., then check in with the Admitting office. Any questions or running late day of surgery: call [phone]  Questions prior to your surgery date: call [phone], Monday-Friday, 8am-4pm. If you experience any cold or flu symptoms such as cough, fever, chills, shortness of breath, etc. between now and your scheduled surgery, please notify us  at the above number.     Remember:  Do not eat after midnight the night before your surgery   You may drink clear liquids until 4:30 AM the morning of your surgery.   Clear liquids allowed are: Water, Non-Citrus Juices (without pulp), Carbonated Beverages, Clear Tea (no milk, honey, etc.), Black Coffee Only (NO MILK, CREAM OR POWDERED CREAMER of any kind), and Gatorade.    Take these medicines the morning of surgery with A SIP OF WATER: ARMOUR THYROID  buPROPion (WELLBUTRIN XL)  Lifitegrast (XIIDRA) eye drops meprazole (PRILOSEC)  progesterone (PROMETRIUM)    May take these medicines IF NEEDED: ALPRAZolam (XANAX)    One week prior to surgery, STOP taking any Aspirin (unless otherwise instructed by your surgeon) Aleve, Naproxen, Ibuprofen, Motrin, Advil, Goody's, BC's, all herbal medications, fish oil, and non-prescription vitamins. This includes your medication: Omega-3 Fatty Acids (MINI OMEGA-3                      Do NOT Smoke (Tobacco/Vaping) for 24 hours prior to your procedure.  If you use a CPAP at night, you may bring your mask/headgear for your overnight stay.   You will be asked to remove any contacts, glasses, piercing's, hearing aid's, dentures/partials prior to surgery. Please bring cases for these items if needed.    Patients discharged the day of surgery will not be allowed to drive home, and someone needs to stay with them for 24 hours.  SURGICAL WAITING ROOM VISITATION Patients may have no more than  2 support people in the waiting area - these visitors may rotate.   Pre-op nurse will coordinate an appropriate time for 1 ADULT support person, who may not rotate, to accompany patient in pre-op.  Children under the age of 1 must have an adult with them who is not the patient and must remain in the main waiting area with an adult.  If the patient needs to stay at the hospital during part of their recovery, the visitor guidelines for inpatient rooms apply.  Please refer to the [facility] website for the visitor guidelines for any additional information.   If you received a COVID test during your pre-op visit  it is requested that you wear a mask when out in public, stay away from anyone that may not be feeling well and notify your surgeon if you develop symptoms. If you have been in contact with anyone that has tested positive in the last 10 days please notify you surgeon.      Pre-operative CHG Bathing Instructions   You can play a key role in reducing the risk of infection after surgery. Your skin needs to be as free of germs as possible. You can reduce the number of germs on your skin by washing with CHG (chlorhexidine gluconate) soap before surgery. CHG is an antiseptic soap that kills germs and continues to kill germs even after washing.   DO NOT use if you have an allergy  to chlorhexidine/CHG or antibacterial soaps. If your skin becomes reddened or irritated,  stop using the CHG and notify one of our RNs at [phone].              TAKE A SHOWER THE NIGHT BEFORE SURGERY AND THE DAY OF SURGERY    Please keep in mind the following:  DO NOT shave, including legs and underarms, 48 hours prior to surgery.   You may shave your face before/day of surgery.  Place clean sheets on your bed the night before surgery Use a clean washcloth (not used since being washed) for each shower. DO NOT sleep with pet's night before surgery.  CHG Shower Instructions:  Wash your face and private area  with normal soap. If you choose to wash your hair, wash first with your normal shampoo.  After you use shampoo/soap, rinse your hair and body thoroughly to remove shampoo/soap residue.  Turn the water OFF and apply half the bottle of CHG soap to a CLEAN washcloth.  Apply CHG soap ONLY FROM YOUR NECK DOWN TO YOUR TOES (washing for 3-5 minutes)  DO NOT use CHG soap on face, private areas, open wounds, or sores.  Pay special attention to the area where your surgery is being performed.  If you are having back surgery, having someone wash your back for you may be helpful. Wait 2 minutes after CHG soap is applied, then you may rinse off the CHG soap.  Pat dry with a clean towel  Put on clean pajamas    Additional instructions for the day of surgery: DO NOT APPLY any lotions, deodorants, cologne, or perfumes.   Do not wear jewelry or makeup Do not wear nail polish, gel polish, artificial nails, or any other type of covering on natural nails (fingers and toes) Do not bring valuables to the hospital. [facility] is not responsible for valuables/personal belongings. Put on clean/comfortable clothes.  Please brush your teeth.  Ask your nurse before applying any prescription medications to the skin.

## 2024-06-21 ENCOUNTER — Other Ambulatory Visit: Payer: Self-pay

## 2024-06-21 ENCOUNTER — Encounter (HOSPITAL_COMMUNITY)
Admission: RE | Admit: 2024-06-21 | Discharge: 2024-06-21 | Disposition: A | Discharge status: Home / Self Care | Source: Ambulatory Visit | Attending: Surgery | Admitting: Surgery

## 2024-06-21 ENCOUNTER — Encounter (HOSPITAL_COMMUNITY): Payer: Self-pay

## 2024-06-21 VITALS — BP 138/85 | HR 79 | Temp 98.2°F | Resp 17 | Ht 67.0 in | Wt 265.0 lb

## 2024-06-21 DIAGNOSIS — Z01818 Encounter for other preprocedural examination: Secondary | ICD-10-CM

## 2024-06-21 DIAGNOSIS — Z01812 Encounter for preprocedural laboratory examination: Secondary | ICD-10-CM | POA: Diagnosis present

## 2024-06-21 HISTORY — DX: Dizziness and giddiness: R42

## 2024-06-21 HISTORY — DX: Unspecified osteoarthritis, unspecified site: M19.90

## 2024-06-21 HISTORY — DX: Hypothyroidism, unspecified: E03.9

## 2024-06-21 HISTORY — DX: Gastro-esophageal reflux disease without esophagitis: K21.9

## 2024-06-21 HISTORY — DX: Anxiety disorder, unspecified: F41.9

## 2024-06-21 HISTORY — DX: Sleep apnea, unspecified: G47.30

## 2024-06-21 LAB — COMPREHENSIVE METABOLIC PANEL WITH GFR
ALT: 24 U/L (ref 0–44)
AST: 28 U/L (ref 15–41)
Albumin: 3.8 g/dL (ref 3.5–5.0)
Alkaline Phosphatase: 110 U/L (ref 38–126)
Anion gap: 11 (ref 5–15)
BUN: 12 mg/dL (ref 8–23)
CO2: 26 mmol/L (ref 22–32)
Calcium: 10 mg/dL (ref 8.9–10.3)
Chloride: 103 mmol/L (ref 98–111)
Creatinine, Ser: 0.82 mg/dL (ref 0.44–1.00)
GFR, Estimated: >60 mL/min (ref >60)
Glucose, Bld: 94 mg/dL (ref 70–99)
Potassium: 5.2 mmol/L — ABNORMAL HIGH (ref 3.5–5.1)
Sodium: 140 mmol/L (ref 135–145)
Total Bilirubin: 1.4 mg/dL — ABNORMAL HIGH (ref 0.0–1.2)
Total Protein: 7.5 g/dL (ref 6.5–8.1)

## 2024-06-21 LAB — CBC
HCT: 45.4 % (ref 36.0–46.0)
Hemoglobin: 15 g/dL (ref 12.0–15.0)
MCH: 28.4 pg (ref 26.0–34.0)
MCHC: 33 g/dL (ref 30.0–36.0)
MCV: 86 fL (ref 80.0–100.0)
Platelets: 266 K/uL (ref 150–400)
RBC: 5.28 MIL/uL — ABNORMAL HIGH (ref 3.87–5.11)
RDW: 13.2 % (ref 11.5–15.5)
WBC: 6.1 K/uL (ref 4.0–10.5)
nRBC: 0 % (ref 0.0–0.2)

## 2024-06-21 NOTE — Progress Notes (Addendum)
 PCP - Dr. Christyne Hurst Cardiologist - Denies  PPM/ICD - Denies Device Orders - n/a Rep Notified - n/a  Chest x-ray - n/a EKG - Denies Stress Test - Denies ECHO - Denies Cardiac Cath - Denies  Sleep Study - +OSA. Pt wears CPAP nightly and is not aware of pressure settings  No DM  Last dose of GLP1 agonist- n/a GLP1 instructions: n/a  Blood Thinner Instructions: n/a Aspirin Instructions: n/a  ERAS Protcol - Clear liquids until 0430 morning of surgery PRE-SURGERY Ensure or G2- n/a  COVID TEST- n/a   Anesthesia review: No - CMP completed due to PCP noting elevated LFTs that may not be related to gallbladder  Patient denies shortness of breath, fever, cough and chest pain at PAT appointment. Pt denies any respiratory illness/infection in the last two months.   All instructions explained to the patient, with a verbal understanding of the material. Patient agrees to go over the instructions while at home for a better understanding. Patient also instructed to self quarantine after being tested for COVID-19. The opportunity to ask questions was provided.

## 2024-06-24 ENCOUNTER — Other Ambulatory Visit: Payer: Self-pay

## 2024-06-24 ENCOUNTER — Ambulatory Visit (HOSPITAL_BASED_OUTPATIENT_CLINIC_OR_DEPARTMENT_OTHER): Admitting: Certified Registered"

## 2024-06-24 ENCOUNTER — Ambulatory Visit (HOSPITAL_COMMUNITY)
Admission: RE | Admit: 2024-06-24 | Discharge: 2024-06-24 | Disposition: A | Discharge status: Home / Self Care | Payer: Self-pay | Attending: Surgery | Admitting: Surgery

## 2024-06-24 ENCOUNTER — Encounter (HOSPITAL_COMMUNITY): Payer: Self-pay | Admitting: Surgery

## 2024-06-24 ENCOUNTER — Encounter (HOSPITAL_COMMUNITY)
Admission: RE | Disposition: A | Discharge status: Home / Self Care | Payer: Self-pay | Source: Home / Self Care | Attending: Surgery

## 2024-06-24 ENCOUNTER — Ambulatory Visit (HOSPITAL_COMMUNITY): Admitting: Certified Registered"

## 2024-06-24 DIAGNOSIS — K8010 Calculus of gallbladder with chronic cholecystitis without obstruction: Secondary | ICD-10-CM | POA: Diagnosis not present

## 2024-06-24 DIAGNOSIS — E039 Hypothyroidism, unspecified: Secondary | ICD-10-CM

## 2024-06-24 DIAGNOSIS — K8020 Calculus of gallbladder without cholecystitis without obstruction: Secondary | ICD-10-CM

## 2024-06-24 DIAGNOSIS — Z6841 Body Mass Index (BMI) 40.0 and over, adult: Secondary | ICD-10-CM | POA: Diagnosis not present

## 2024-06-24 DIAGNOSIS — E66813 Obesity, class 3: Secondary | ICD-10-CM | POA: Insufficient documentation

## 2024-06-24 DIAGNOSIS — G4730 Sleep apnea, unspecified: Secondary | ICD-10-CM | POA: Diagnosis not present

## 2024-06-24 DIAGNOSIS — F419 Anxiety disorder, unspecified: Secondary | ICD-10-CM | POA: Diagnosis not present

## 2024-06-24 DIAGNOSIS — M1990 Unspecified osteoarthritis, unspecified site: Secondary | ICD-10-CM | POA: Diagnosis not present

## 2024-06-24 DIAGNOSIS — K219 Gastro-esophageal reflux disease without esophagitis: Secondary | ICD-10-CM | POA: Insufficient documentation

## 2024-06-24 HISTORY — PX: CHOLECYSTECTOMY: SHX55

## 2024-06-24 SURGERY — LAPAROSCOPIC CHOLECYSTECTOMY
Anesthesia: General | Site: Abdomen

## 2024-06-24 MED ORDER — ONDANSETRON HCL 4 MG/2ML IJ SOLN
INTRAMUSCULAR | Status: DC | PRN
  Administered 2024-06-24: 4 mg via INTRAVENOUS

## 2024-06-24 MED ORDER — OXYCODONE HCL 5 MG PO TABS: 5.0000 mg | ORAL_TABLET | Freq: Once | ORAL | Status: DC | PRN

## 2024-06-24 MED ORDER — 0.9 % SODIUM CHLORIDE (POUR BTL) OPTIME
TOPICAL | Status: DC | PRN
  Administered 2024-06-24: 1000 mL

## 2024-06-24 MED ORDER — PROPOFOL 10 MG/ML IV BOLUS
INTRAVENOUS | Status: DC | PRN
  Administered 2024-06-24: 30 mg via INTRAVENOUS
  Administered 2024-06-24: 150 mg via INTRAVENOUS

## 2024-06-24 MED ORDER — LACTATED RINGERS IV SOLN: INTRAVENOUS | Status: DC

## 2024-06-24 MED ORDER — FENTANYL CITRATE (PF) 250 MCG/5ML IJ SOLN
INTRAMUSCULAR | Status: AC
  Filled 2024-06-24: qty 5

## 2024-06-24 MED ORDER — ORAL CARE MOUTH RINSE: 15.0000 mL | Freq: Once | OROMUCOSAL | Status: AC

## 2024-06-24 MED ORDER — ONDANSETRON HCL 4 MG/2ML IJ SOLN
INTRAMUSCULAR | Status: AC
Start: 2024-06-24 — End: 2024-06-24
  Filled 2024-06-24: qty 2

## 2024-06-24 MED ORDER — SODIUM CHLORIDE 0.9 % IR SOLN
Status: DC | PRN
Start: 2024-06-24 — End: 2024-06-24
  Administered 2024-06-24: 1000 mL

## 2024-06-24 MED ORDER — FENTANYL CITRATE (PF) 100 MCG/2ML IJ SOLN
25.0000 ug | INTRAMUSCULAR | Status: DC | PRN
  Administered 2024-06-24 (×2): 50 ug via INTRAVENOUS

## 2024-06-24 MED ORDER — BUPIVACAINE-EPINEPHRINE 0.25% -1:200000 IJ SOLN
INTRAMUSCULAR | Status: DC | PRN
  Administered 2024-06-24: 30 mL

## 2024-06-24 MED ORDER — DEXAMETHASONE SODIUM PHOSPHATE 10 MG/ML IJ SOLN
INTRAMUSCULAR | Status: AC
Start: 2024-06-24 — End: 2024-06-24
  Filled 2024-06-24: qty 1

## 2024-06-24 MED ORDER — BUPIVACAINE-EPINEPHRINE (PF) 0.25% -1:200000 IJ SOLN
INTRAMUSCULAR | Status: AC
  Filled 2024-06-24: qty 30

## 2024-06-24 MED ORDER — FENTANYL CITRATE (PF) 100 MCG/2ML IJ SOLN
INTRAMUSCULAR | Status: AC
Start: 2024-06-24 — End: 2024-06-24
  Filled 2024-06-24: qty 2

## 2024-06-24 MED ORDER — VANCOMYCIN HCL 1500 MG/300ML IV SOLN
1500.0000 mg | INTRAVENOUS | Status: AC
  Administered 2024-06-24: 1500 mg via INTRAVENOUS
  Filled 2024-06-24: qty 300

## 2024-06-24 MED ORDER — MIDAZOLAM HCL 2 MG/2ML IJ SOLN
INTRAMUSCULAR | Status: AC
Start: 2024-06-24 — End: 2024-06-24
  Filled 2024-06-24: qty 2

## 2024-06-24 MED ORDER — DEXAMETHASONE SODIUM PHOSPHATE 10 MG/ML IJ SOLN
INTRAMUSCULAR | Status: DC | PRN
  Administered 2024-06-24: 4 mg via INTRAVENOUS

## 2024-06-24 MED ORDER — ROCURONIUM BROMIDE 10 MG/ML (PF) SYRINGE
PREFILLED_SYRINGE | INTRAVENOUS | Status: AC
  Filled 2024-06-24: qty 10

## 2024-06-24 MED ORDER — MIDAZOLAM HCL 2 MG/2ML IJ SOLN
INTRAMUSCULAR | Status: DC | PRN
  Administered 2024-06-24: 2 mg via INTRAVENOUS

## 2024-06-24 MED ORDER — PROPOFOL 10 MG/ML IV BOLUS
INTRAVENOUS | Status: AC
  Filled 2024-06-24: qty 20

## 2024-06-24 MED ORDER — ACETAMINOPHEN 500 MG PO TABS
1000.0000 mg | ORAL_TABLET | ORAL | Status: AC
  Administered 2024-06-24: 1000 mg via ORAL
  Filled 2024-06-24: qty 2

## 2024-06-24 MED ORDER — HYDROCODONE-ACETAMINOPHEN 5-325 MG PO TABS: 1.0000 | ORAL_TABLET | Freq: Four times a day (QID) | ORAL | 0 refills | Status: DC | PRN

## 2024-06-24 MED ORDER — FENTANYL CITRATE (PF) 250 MCG/5ML IJ SOLN
INTRAMUSCULAR | Status: DC | PRN
  Administered 2024-06-24 (×5): 50 ug via INTRAVENOUS

## 2024-06-24 MED ORDER — LIDOCAINE 2% (20 MG/ML) 5 ML SYRINGE
INTRAMUSCULAR | Status: AC
Start: 2024-06-24 — End: 2024-06-24
  Filled 2024-06-24: qty 5

## 2024-06-24 MED ORDER — INDOCYANINE GREEN 25 MG IV SOLR
1.2500 mg | Freq: Once | INTRAVENOUS | Status: AC
  Administered 2024-06-24: 1.25 mg via INTRAVENOUS

## 2024-06-24 MED ORDER — CHLORHEXIDINE GLUCONATE 0.12 % MT SOLN
15.0000 mL | Freq: Once | OROMUCOSAL | Status: AC
  Administered 2024-06-24: 15 mL via OROMUCOSAL
  Filled 2024-06-24: qty 15

## 2024-06-24 MED ORDER — ROCURONIUM BROMIDE 10 MG/ML (PF) SYRINGE
PREFILLED_SYRINGE | INTRAVENOUS | Status: DC | PRN
  Administered 2024-06-24: 60 mg via INTRAVENOUS

## 2024-06-24 MED ORDER — AMISULPRIDE (ANTIEMETIC) 5 MG/2ML IV SOLN: 10.0000 mg | Freq: Once | INTRAVENOUS | Status: DC | PRN

## 2024-06-24 MED ORDER — PHENYLEPHRINE HCL-NACL 20-0.9 MG/250ML-% IV SOLN
INTRAVENOUS | Status: DC | PRN
  Administered 2024-06-24: 50 ug/min via INTRAVENOUS

## 2024-06-24 MED ORDER — LIDOCAINE 2% (20 MG/ML) 5 ML SYRINGE
INTRAMUSCULAR | Status: DC | PRN
  Administered 2024-06-24: 100 mg via INTRAVENOUS

## 2024-06-24 MED ORDER — CELECOXIB 200 MG PO CAPS
200.0000 mg | ORAL_CAPSULE | ORAL | Status: AC
  Administered 2024-06-24: 200 mg via ORAL
  Filled 2024-06-24: qty 1

## 2024-06-24 MED ORDER — SUGAMMADEX SODIUM 200 MG/2ML IV SOLN
INTRAVENOUS | Status: DC | PRN
  Administered 2024-06-24: 200 mg via INTRAVENOUS

## 2024-06-24 MED ORDER — OXYCODONE HCL 5 MG/5ML PO SOLN: 5.0000 mg | Freq: Once | ORAL | Status: DC | PRN

## 2024-06-24 MED ORDER — EPHEDRINE SULFATE-NACL 50-0.9 MG/10ML-% IV SOSY
PREFILLED_SYRINGE | INTRAVENOUS | Status: DC | PRN
  Administered 2024-06-24: 10 mg via INTRAVENOUS

## 2024-06-24 SURGICAL SUPPLY — 38 items
BAG COUNTER SPONGE SURGICOUNT (BAG) ×1 IMPLANT
BLADE CLIPPER SURG (BLADE) IMPLANT
CANISTER SUCTION 3000ML PPV (SUCTIONS) ×1 IMPLANT
CHLORAPREP W/TINT 26 (MISCELLANEOUS) ×1 IMPLANT
CLIP APPLIE 5 13 M/L LIGAMAX5 (MISCELLANEOUS) ×1 IMPLANT
COVER SURGICAL LIGHT HANDLE (MISCELLANEOUS) ×1 IMPLANT
DERMABOND ADVANCED .7 DNX12 (GAUZE/BANDAGES/DRESSINGS) ×1 IMPLANT
DRAPE LAPAROSCOPIC ABDOMINAL (DRAPES) IMPLANT
ELECTRODE REM PT RTRN 9FT ADLT (ELECTROSURGICAL) ×1 IMPLANT
ENDOLOOP SUT PDS II 0 18 (SUTURE) IMPLANT
GLOVE BIOGEL PI IND STRL 6 (GLOVE) ×1 IMPLANT
GLOVE BIOGEL PI MICRO STRL 5.5 (GLOVE) ×1 IMPLANT
GOWN STRL REUS W/ TWL LRG LVL3 (GOWN DISPOSABLE) ×3 IMPLANT
GRASPER SUT TROCAR 14GX15 (MISCELLANEOUS) IMPLANT
IRRIGATION SUCT STRKRFLW 2 WTP (MISCELLANEOUS) ×1 IMPLANT
KIT BASIN OR (CUSTOM PROCEDURE TRAY) ×1 IMPLANT
KIT IMAGING PINPOINTPAQ (MISCELLANEOUS) IMPLANT
KIT TURNOVER KIT B (KITS) ×1 IMPLANT
LHOOK LAP DISP 36CM (ELECTROSURGICAL) ×1 IMPLANT
NDL INSUFFLATION 14GA 120MM (NEEDLE) IMPLANT
NEEDLE INSUFFLATION 14GA 120MM (NEEDLE) ×1 IMPLANT
NS IRRIG 1000ML POUR BTL (IV SOLUTION) ×1 IMPLANT
PAD ARMBOARD POSITIONER FOAM (MISCELLANEOUS) ×1 IMPLANT
PENCIL BUTTON HOLSTER BLD 10FT (ELECTRODE) ×1 IMPLANT
POUCH RETRIEVAL ECOSAC 10 (ENDOMECHANICALS) IMPLANT
SCISSORS LAP 5X35 DISP (ENDOMECHANICALS) ×1 IMPLANT
SET TUBE SMOKE EVAC HIGH FLOW (TUBING) ×1 IMPLANT
SLEEVE Z-THREAD 5X100MM (TROCAR) ×2 IMPLANT
SUT MNCRL AB 4-0 PS2 18 (SUTURE) ×1 IMPLANT
SYSTEM BAG RETRIEVAL 10MM (BASKET) IMPLANT
TOWEL GREEN STERILE (TOWEL DISPOSABLE) ×1 IMPLANT
TOWEL GREEN STERILE FF (TOWEL DISPOSABLE) ×1 IMPLANT
TRAY LAPAROSCOPIC MC (CUSTOM PROCEDURE TRAY) ×1 IMPLANT
TROCAR BALLN 12MMX100 BLUNT (TROCAR) ×1 IMPLANT
TROCAR Z THREAD OPTICAL 12X100 (TROCAR) IMPLANT
TROCAR Z-THREAD OPTICAL 5X100M (TROCAR) ×1 IMPLANT
WARMER LAPAROSCOPE (MISCELLANEOUS) ×1 IMPLANT
WATER STERILE IRR 1000ML POUR (IV SOLUTION) ×1 IMPLANT

## 2024-06-24 NOTE — Op Note (Signed)
 Date: 06/24/24  Patient: Barbara Michael MRN: [id number]  Preoperative Diagnosis: Symptomatic cholelithiasis Postoperative Diagnosis: Same  Procedure: Laparoscopic cholecystectomy  Surgeon: Leonor Dawn, MD Assistant: Eva Barrier  EBL: 20 mL  Anesthesia: General endotracheal  Specimens: Gallbladder  Indications: Barbara Michael is a 63 yo female who was referred with intermittent RUQ pain for several years. Previous ultrasound showed cholelithiasis. After a discussion of the risks and benefits of surgery, she agreed to proceed with cholecystectomy.  Findings: Cholelithiasis without signs of acute cholecystitis. Hepatic steatosis.  Procedure details: Informed consent was obtained in the preoperative area prior to the procedure. The patient was brought to the operating room and placed on the table in the supine position. General anesthesia was induced and appropriate lines and drains were placed for intraoperative monitoring. Perioperative antibiotics were administered per SCIP guidelines. The abdomen was prepped and draped in the usual sterile fashion. A pre-procedure timeout was taken verifying patient identity, surgical site and procedure to be performed.  A small supraumbilical skin incision was made, the umbilical stalk was grasped and elevated, and a Veress needle was inserted through the fascia.  Intraperitoneal placement was confirmed with the saline drop test and the abdomen was insufflated.  A 5 mm Visiport was placed and the peritoneal cavity was inspected with no evidence of visceral vascular injury.  A 12mm subxiphoid port was placed, followed by two 5 mm ports in the right subcostal margin, all under direct visualization.  The fundus of the gallbladder was grasped and retracted cephalad.  The liver was steatotic and enlarged, and the left lobe of the liver was enlarged and floppy, making exposure of the gallbladder difficult.  An additional 5 mm port was placed in the right upper  quadrant to assist with liver retraction.  The infundibulum of the gallbladder was retracted laterally and the peritoneum overlying the gallbladder was incised with cautery.  The cystic triangle was dissected out using cautery and blunt dissection.  We attempted to use ICG, however it did not appear that there was any excretion of the ICG dye in the biliary tree.  The cystic duct and cystic artery were circumferentially dissected out, and the lower portion of the gallbladder was separated from the liver using cautery.  The critical view of safety was obtained.  The cystic artery was clipped and divided.  The cystic duct was dilated and could not be completely crossed with a clip.  The cystic duct was sharply divided and the cystic duct stump was closed with two PDS Endoloops.  The gallbladder was taken off the liver using cautery.  There were gallstones that spilled which were extracted using a stone grasper.  The specimen was placed in an Endo Catch bag and extracted via the subxiphoid port.  The surgical site was irrigated.  There was a superficial liver laceration adjacent to the gallbladder fossa from retraction. There was a small amount of oozing and hemostasis was achieved using cautery.  The cystic duct stump was visualized and there was no evidence of bile leak.  The abdomen was again irrigated and appeared hemostatic.  The subxiphoid port site fascia was closed with 0 Vicryl suture using a PMI.  The pneumoperitoneum was evacuated and the remaining ports were removed.  The skin at each port site was closed with 4-0 Monocryl subcuticular suture.  Dermabond was applied.  The patient tolerated the procedure well with no apparent complications.  All counts were correct x2 at the end of the procedure. The patient was extubated and  taken to PACU in stable condition.  Leonor Dawn, MD 06/24/24 1:32 PM

## 2024-06-24 NOTE — Discharge Instructions (Addendum)
 [facility] DISCHARGE INSTRUCTIONS  Activity No heavy lifting greater than 15 pounds for 4 weeks after surgery. Ok to shower in 24 hours, but do not bathe or submerge incisions underwater. Do not drive while taking narcotic pain medication. You may drive when you are no longer taking prescription pain medication, you can comfortably wear a seatbelt, and you can safely maneuver your car and apply brakes.  Wound Care Your incisions are covered with skin glue called Dermabond. This will peel off on its own over time. You may shower and allow warm soapy water to run over your incisions. Gently pat dry. Do not submerge your incision underwater until cleared by your surgeon. Monitor your incision for any new redness, tenderness, or drainage. Many patients will experience some swelling and bruising at the incisions.  Ice packs will help.  Swelling and bruising can take several days to resolve.   Medications A  prescription for pain medication may be given to you upon discharge.  Take your pain medication as prescribed, if needed.  If narcotic pain medicine is not needed, then you may take acetaminophen  (Tylenol ) or ibuprofen (Advil) as needed. It is common to experience some constipation if taking pain medication after surgery.  Increasing fluid intake and taking a stool softener (such as Colace) will usually help or prevent this problem from occurring.  A mild laxative (Milk of Magnesia or Miralax) should be taken according to package directions if there are no bowel movements after 48 hours. Take your usually prescribed medications unless otherwise directed. If you need a refill on your pain medication, please contact your pharmacy.  They will contact our office to request authorization. Prescriptions will not be filled after 5 pm or on weekends.  When to Call Us : Fever greater than 100.5 New redness, drainage, or swelling at incision site Severe pain, nausea, or  vomiting Persistent bleeding from incisions Jaundice (yellowing of the whites of the eyes or skin)  Follow-up You have an appointment scheduled with Dr. Dasie on September 10 at 9:30am. This will be at the [facility] office at [address], [address], [place], [place]. Please arrive at least 15 minutes prior to your scheduled appointment time.  IF YOU HAVE DISABILITY OR FAMILY LEAVE FORMS, YOU MUST BRING THEM TO THE OFFICE FOR PROCESSING.   DO NOT GIVE THEM TO YOUR DOCTOR.  The clinic staff is available to answer your questions during regular business hours.  Please don't hesitate to call and ask to speak to one of the nurses for clinical concerns.  If you have a medical emergency, go to the nearest emergency room or call 911.  A surgeon from [facility] is always on call at the hospital  [address], [address], [place], [place]  [address] ?  [address] [address], [place], [place]   [address] [phone] ? Toll Free: [phone] ? FAX [phone] Web site: [email or web address]      Managing Your Pain After Surgery Without Opioids    Thank you for participating in our program to help patients manage their pain after surgery without opioids. This is part of our effort to provide you with the best care possible, without exposing you or your family to the risk that opioids pose.  What pain can I expect after surgery? You can expect to have some pain after surgery. This is normal. The pain is typically worse the day after surgery, and quickly begins to get better. Many studies have found  that many patients are able to manage their pain after surgery with Over-the-Counter (OTC) medications such as Tylenol  and Motrin. If you have a condition that does not allow you to take Tylenol  or Motrin, notify your surgical team.  How will I manage my pain? The best strategy for controlling your pain after surgery is around the clock pain control with Tylenol   (acetaminophen ) and Motrin (ibuprofen or Advil). Alternating these medications with each other allows you to maximize your pain control. In addition to Tylenol  and Motrin, you can use heating pads or ice packs on your incisions to help reduce your pain.  How will I alternate your regular strength over-the-counter pain medication? You will take a dose of pain medication every three hours. Start by taking 650 mg of Tylenol  (2 pills of 325 mg) 3 hours later take 600 mg of Motrin (3 pills of 200 mg) 3 hours after taking the Motrin take 650 mg of Tylenol  3 hours after that take 600 mg of Motrin.   - 1 -  See example - if your first dose of Tylenol  is at 12:00 PM   12:00 PM Tylenol  650 mg (2 pills of 325 mg)  3:00 PM Motrin 600 mg (3 pills of 200 mg)  6:00 PM Tylenol  650 mg (2 pills of 325 mg)  9:00 PM Motrin 600 mg (3 pills of 200 mg)  Continue alternating every 3 hours   We recommend that you follow this schedule around-the-clock for at least 3 days after surgery, or until you feel that it is no longer needed. Use the table on the last page of this handout to keep track of the medications you are taking. Important: Do not take more than 3000mg  of Tylenol  or 3200mg  of Motrin in a 24-hour period. Do not take ibuprofen/Motrin if you have a history of bleeding stomach ulcers, severe kidney disease, &/or actively taking a blood thinner  What if I still have pain? If you have pain that is not controlled with the over-the-counter pain medications (Tylenol  and Motrin or Advil) you might have what we call "breakthrough" pain. You will receive a prescription for a small amount of an opioid pain medication such as Oxycodone , Tramadol, or Tylenol  with Codeine. Use these opioid pills in the first 24 hours after surgery if you have breakthrough pain. Do not take more than 1 pill every 4-6 hours.  If you still have uncontrolled pain after using all opioid pills, don't hesitate to call our staff using the  number provided. We will help make sure you are managing your pain in the best way possible, and if necessary, we can provide a prescription for additional pain medication.   Day 1    Time  Name of Medication Number of pills taken  Amount of Acetaminophen   Pain Level   Comments  AM PM       AM PM       AM PM       AM PM       AM PM       AM PM       AM PM       AM PM       Total Daily amount of Acetaminophen  Do not take more than  3,000 mg per day      Day 2    Time  Name of Medication Number of pills taken  Amount of Acetaminophen   Pain Level   Comments  AM PM  AM PM       AM PM       AM PM       AM PM       AM PM       AM PM       AM PM       Total Daily amount of Acetaminophen  Do not take more than  3,000 mg per day      Day 3    Time  Name of Medication Number of pills taken  Amount of Acetaminophen   Pain Level   Comments  AM PM       AM PM       AM PM       AM PM         AM PM       AM PM       AM PM       AM PM       Total Daily amount of Acetaminophen  Do not take more than  3,000 mg per day      Day 4    Time  Name of Medication Number of pills taken  Amount of Acetaminophen   Pain Level   Comments  AM PM       AM PM       AM PM       AM PM       AM PM       AM PM       AM PM       AM PM       Total Daily amount of Acetaminophen  Do not take more than  3,000 mg per day      Day 5    Time  Name of Medication Number of pills taken  Amount of Acetaminophen   Pain Level   Comments  AM PM       AM PM       AM PM       AM PM       AM PM       AM PM       AM PM       AM PM       Total Daily amount of Acetaminophen  Do not take more than  3,000 mg per day      Day 6    Time  Name of Medication Number of pills taken  Amount of Acetaminophen   Pain Level  Comments  AM PM       AM PM       AM PM       AM PM       AM PM       AM PM       AM PM       AM PM       Total Daily amount of Acetaminophen  Do  not take more than  3,000 mg per day      Day 7    Time  Name of Medication Number of pills taken  Amount of Acetaminophen   Pain Level   Comments  AM PM       AM PM       AM PM       AM PM       AM PM       AM PM       AM PM       AM PM  Total Daily amount of Acetaminophen  Do not take more than  3,000 mg per day        For additional information about how and where to safely dispose of unused opioid medications - [email or web address]  Disclaimer: This document contains information and/or instructional materials adapted from [facility] for the typical patient with your condition. It does not replace medical advice from your health care provider because your experience may differ from that of the typical patient. Talk to your health care provider if you have any questions about this document, your condition or your treatment plan. Adapted from [facility]

## 2024-06-24 NOTE — Anesthesia Procedure Notes (Signed)
 Procedure Name: Intubation Date/Time: 06/24/2024 7:44 AM  Performed by: Myrna Homer, CRNAPre-anesthesia Checklist: Patient identified, Emergency Drugs available, Suction available and Patient being monitored Patient Re-evaluated:Patient Re-evaluated prior to induction Oxygen Delivery Method: Circle System Utilized Preoxygenation: Pre-oxygenation with 100% oxygen Induction Type: IV induction Ventilation: Mask ventilation without difficulty Laryngoscope Size: Glidescope and 3 Grade View: Grade I Tube type: Oral Tube size: 7.0 mm Number of attempts: 1 Airway Equipment and Method: Stylet and Oral airway Placement Confirmation: ETT inserted through vocal cords under direct vision, positive ETCO2 and breath sounds checked- equal and bilateral Secured at: 22 cm Tube secured with: Tape Dental Injury: Teeth and Oropharynx as per pre-operative assessment

## 2024-06-24 NOTE — Transfer of Care (Signed)
 Immediate Anesthesia Transfer of Care Note  Patient: Barbara Michael  Procedure(s) Performed: LAPAROSCOPIC CHOLECYSTECTOMY WITH ICG DYE (Abdomen)  Patient Location: PACU  Anesthesia Type:General  Level of Consciousness: awake, drowsy, patient cooperative, and responds to stimulation  Airway & Oxygen Therapy: Patient Spontanous Breathing and Patient connected to face mask oxygen  Post-op Assessment: Report given to RN, Post -op Vital signs reviewed and stable, and Patient moving all extremities X 4  Post vital signs: Reviewed and stable  Last Vitals:  Vitals Value Taken Time  BP 154/83 06/24/24 09:08  Temp    Pulse 93 06/24/24 09:10  Resp 17 06/24/24 09:10  SpO2 99 % 06/24/24 09:10  Vitals shown include unfiled device data.  Last Pain:  Vitals:   06/24/24 0642  TempSrc:   PainSc: 0-No pain         Complications: No notable events documented.

## 2024-06-24 NOTE — Interval H&P Note (Signed)
 History and Physical Interval Note:  06/24/2024 7:16 AM  Barbara Michael  has presented today for surgery, with the diagnosis of GALLSTONES.  The various methods of treatment have been discussed with the patient and family. After consideration of risks, benefits and other options for treatment, the patient has consented to  Procedure(s): LAPAROSCOPIC CHOLECYSTECTOMY (N/A) as a surgical intervention.  The patient's history has been reviewed, patient examined, no change in status, stable for surgery.  I have reviewed the patient's chart and labs.  Questions were answered to the patient's satisfaction.     [name]

## 2024-06-24 NOTE — Anesthesia Preprocedure Evaluation (Addendum)
 Anesthesia Evaluation  Patient identified by MRN, date of birth, ID band Patient awake    Reviewed: Allergy  & Precautions, NPO status , Patient's Chart, lab work & pertinent test results  Airway Mallampati: III  TM Distance: >3 FB Neck ROM: Full    Dental no notable dental hx. (+) Teeth Intact, Dental Advisory Given   Pulmonary sleep apnea and Continuous Positive Airway Pressure Ventilation    Pulmonary exam normal breath sounds clear to auscultation       Cardiovascular negative cardio ROS Normal cardiovascular exam Rhythm:Regular Rate:Normal     Neuro/Psych  PSYCHIATRIC DISORDERS Anxiety     negative neurological ROS     GI/Hepatic Neg liver ROS,GERD  ,,  Endo/Other  Hypothyroidism  Class 3 obesity (BMI 41)  Renal/GU negative Renal ROS  negative genitourinary   Musculoskeletal  (+) Arthritis ,    Abdominal   Peds  Hematology negative hematology ROS (+)   Anesthesia Other Findings   Reproductive/Obstetrics                              Anesthesia Physical Anesthesia Plan  ASA: 3  Anesthesia Plan: General   Post-op Pain Management: Tylenol  PO (pre-op)*   Induction: Intravenous  PONV Risk Score and Plan: 3 and Midazolam , Dexamethasone  and Ondansetron   Airway Management Planned: Oral ETT  Additional Equipment:   Intra-op Plan:   Post-operative Plan: Extubation in OR  Informed Consent: I have reviewed the patients History and Physical, chart, labs and discussed the procedure including the risks, benefits and alternatives for the proposed anesthesia with the patient or authorized representative who has indicated his/her understanding and acceptance.     Dental advisory given  Plan Discussed with: CRNA  Anesthesia Plan Comments:          Anesthesia Quick Evaluation

## 2024-06-25 ENCOUNTER — Encounter (HOSPITAL_COMMUNITY): Payer: Self-pay | Admitting: Surgery

## 2024-06-25 NOTE — Anesthesia Postprocedure Evaluation (Signed)
 Anesthesia Post Note  Patient: Barbara Michael  Procedure(s) Performed: LAPAROSCOPIC CHOLECYSTECTOMY WITH ICG DYE (Abdomen)     Patient location during evaluation: PACU Anesthesia Type: General Level of consciousness: awake and alert Pain management: pain level controlled Vital Signs Assessment: post-procedure vital signs reviewed and stable Respiratory status: spontaneous breathing, nonlabored ventilation, respiratory function stable and patient connected to nasal cannula oxygen Cardiovascular status: blood pressure returned to baseline and stable Postop Assessment: no apparent nausea or vomiting Anesthetic complications: no   No notable events documented.  Last Vitals:  Vitals:   06/24/24 0930 06/24/24 0945  BP: 127/72 122/73  Pulse: 81 78  Resp: 12 20  Temp:  36.4 C  SpO2: 94% 95%    Last Pain:  Vitals:   06/24/24 0929  TempSrc:   PainSc: 5                  Shaneca Orne L Evaline Waltman

## 2024-06-28 LAB — SURGICAL PATHOLOGY

## 2024-07-13 NOTE — Progress Notes (Signed)
   PROVIDER:  SHELBY LYNN ALLEN, MD  MRN: [id number] DOB: 01/07/1961 DATE OF ENCOUNTER: 07/13/2024 Interval History:     Barbara Michael presents for postop follow up after undergoing lap cholecystectomy on 8/14. She says the first week after surgery she had significant pain, particularly right shoulder pain. She initially had constipation and developed a small hemorrhoid, but is now having a single fairly loose stool daily with some urgency. She is overall feeling much better, but still has some pain on the right side with certain movements. She has been avoiding fatty and greasy foods. 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  Surgical Pathology: A. GALLBLADDER, CHOLECYSTECTOMY:  Chronic cholecystitis.  Cholelithiasis.      Physical Examination:   Physical Exam Constitutional:      General: She is not in acute distress.    Appearance: Normal appearance.  Pulmonary:     Effort: Pulmonary effort is normal. No respiratory distress.  Abdominal:     General: There is no distension.     Palpations: Abdomen is soft.     Tenderness: There is no abdominal tenderness.     Comments: Incisions are clean and dry with no erythema, induration or drainage.  Skin:    General: Skin is warm and dry.     Coloration: Skin is not jaundiced.  Neurological:     General: No focal deficit present.     Mental Status: She is alert and oriented to person, place, and time.        Assessment and Plan:     Barbara Michael is a 63 y.o. female who underwent laparoscopic cholecystectomy on 06/24/24. She initially had some constipation and significant pain, which is improving. Her pain primarily seems to be musculoskeletal at her incision sites, and I counseled that this will continue to improve. I recommended adding a fiber supplement to help bulk stools and minimize straining with bowel movements. If loose stools or stool urgency worsens, can start cholestyramine but she  is currently only having one bowel movement a day. I also reviewed her intra-op findings of fatty liver, and we discussed diet and lifestyle changes to prevent progression of this. She may return to work tomorrow with activity restrictions for 2 more weeks, and was provided with a return to work note today. I offered one month follow up vs as needed if symptoms worsen, and she would like to call as needed if she does not continue to improve. All questions were answered. Her surgical pathology was benign, which I reviewed with her.  Diagnoses and all orders for this visit:  S/P laparoscopic cholecystectomy  Acute postoperative pain        Return if symptoms worsen or fail to improve.   The plan was discussed in detail with the patient today, who expressed understanding.  The patient has my contact information, and understands to call me with any additional questions or concerns in the interval.  I would be happy to see the patient back sooner if the need arises.   SHELBY LYNN ALLEN, MD

## 2024-07-22 ENCOUNTER — Encounter: Payer: Self-pay | Admitting: Internal Medicine

## 2024-07-27 ENCOUNTER — Other Ambulatory Visit (HOSPITAL_COMMUNITY): Payer: Self-pay | Admitting: Surgery

## 2024-07-27 ENCOUNTER — Other Ambulatory Visit: Payer: Self-pay

## 2024-07-27 ENCOUNTER — Other Ambulatory Visit: Payer: Self-pay | Admitting: Surgery

## 2024-07-27 DIAGNOSIS — R7989 Other specified abnormal findings of blood chemistry: Secondary | ICD-10-CM

## 2024-07-27 DIAGNOSIS — R17 Unspecified jaundice: Secondary | ICD-10-CM

## 2024-07-28 ENCOUNTER — Ambulatory Visit (HOSPITAL_COMMUNITY)
Admission: RE | Admit: 2024-07-28 | Discharge: 2024-07-28 | Discharge status: Home / Self Care | Source: Ambulatory Visit | Attending: Surgery

## 2024-07-28 ENCOUNTER — Other Ambulatory Visit (HOSPITAL_COMMUNITY): Payer: Self-pay | Admitting: Surgery

## 2024-07-28 DIAGNOSIS — R932 Abnormal findings on diagnostic imaging of liver and biliary tract: Secondary | ICD-10-CM | POA: Diagnosis not present

## 2024-07-28 DIAGNOSIS — R110 Nausea: Secondary | ICD-10-CM | POA: Diagnosis present

## 2024-07-28 DIAGNOSIS — R7989 Other specified abnormal findings of blood chemistry: Secondary | ICD-10-CM

## 2024-07-28 DIAGNOSIS — E039 Hypothyroidism, unspecified: Secondary | ICD-10-CM | POA: Diagnosis not present

## 2024-07-28 DIAGNOSIS — K8050 Calculus of bile duct without cholangitis or cholecystitis without obstruction: Secondary | ICD-10-CM | POA: Diagnosis not present

## 2024-07-28 MED ORDER — GADOBUTROL 1 MMOL/ML IV SOLN
10.0000 mL | Freq: Once | INTRAVENOUS | Status: AC | PRN
  Administered 2024-07-28: 10 mL via INTRAVENOUS

## 2024-07-29 ENCOUNTER — Encounter (HOSPITAL_COMMUNITY): Payer: Self-pay

## 2024-07-29 ENCOUNTER — Encounter (HOSPITAL_COMMUNITY): Payer: Self-pay | Admitting: Surgery

## 2024-07-29 ENCOUNTER — Other Ambulatory Visit: Payer: Self-pay

## 2024-07-29 ENCOUNTER — Observation Stay (HOSPITAL_COMMUNITY)
Admission: RE | Admit: 2024-07-29 | Discharge: 2024-07-31 | Disposition: A | Discharge status: Home / Self Care | Source: Ambulatory Visit | Attending: Surgery | Admitting: Surgery

## 2024-07-29 DIAGNOSIS — R932 Abnormal findings on diagnostic imaging of liver and biliary tract: Secondary | ICD-10-CM | POA: Insufficient documentation

## 2024-07-29 DIAGNOSIS — E039 Hypothyroidism, unspecified: Secondary | ICD-10-CM | POA: Insufficient documentation

## 2024-07-29 DIAGNOSIS — K8050 Calculus of bile duct without cholangitis or cholecystitis without obstruction: Secondary | ICD-10-CM | POA: Diagnosis not present

## 2024-07-29 LAB — COMPREHENSIVE METABOLIC PANEL WITH GFR
ALT: 161 U/L — ABNORMAL HIGH (ref 0–44)
AST: 69 U/L — ABNORMAL HIGH (ref 15–41)
Albumin: 2.9 g/dL — ABNORMAL LOW (ref 3.5–5.0)
Alkaline Phosphatase: 284 U/L — ABNORMAL HIGH (ref 38–126)
Anion gap: 15 (ref 5–15)
BUN: 8 mg/dL (ref 8–23)
CO2: 22 mmol/L (ref 22–32)
Calcium: 9.2 mg/dL (ref 8.9–10.3)
Chloride: 100 mmol/L (ref 98–111)
Creatinine, Ser: 0.7 mg/dL (ref 0.44–1.00)
GFR, Estimated: >60 mL/min (ref >60)
Glucose, Bld: 99 mg/dL (ref 70–99)
Potassium: 3.5 mmol/L (ref 3.5–5.1)
Sodium: 137 mmol/L (ref 135–145)
Total Bilirubin: 7.5 mg/dL — ABNORMAL HIGH (ref 0.0–1.2)
Total Protein: 7 g/dL (ref 6.5–8.1)

## 2024-07-29 LAB — CBC
HCT: 40 % (ref 36.0–46.0)
Hemoglobin: 13 g/dL (ref 12.0–15.0)
MCH: 28 pg (ref 26.0–34.0)
MCHC: 32.5 g/dL (ref 30.0–36.0)
MCV: 86 fL (ref 80.0–100.0)
Platelets: 262 K/uL (ref 150–400)
RBC: 4.65 MIL/uL (ref 3.87–5.11)
RDW: 14.4 % (ref 11.5–15.5)
WBC: 11.3 K/uL — ABNORMAL HIGH (ref 4.0–10.5)
nRBC: 0 % (ref 0.0–0.2)

## 2024-07-29 MED ORDER — BUPROPION HCL ER (XL) 150 MG PO TB24
150.0000 mg | ORAL_TABLET | Freq: Every day | ORAL | Status: DC
Start: 2024-07-30 — End: 2024-07-31
  Administered 2024-07-30 – 2024-07-31 (×2): 150 mg via ORAL
  Filled 2024-07-29 (×2): qty 1

## 2024-07-29 MED ORDER — MELATONIN 3 MG PO TABS: 3.0000 mg | ORAL_TABLET | Freq: Every evening | ORAL | Status: DC | PRN

## 2024-07-29 MED ORDER — ALPRAZOLAM 0.25 MG PO TABS
0.2500 mg | ORAL_TABLET | Freq: Two times a day (BID) | ORAL | Status: DC | PRN
  Administered 2024-07-30: 0.25 mg via ORAL
  Filled 2024-07-29: qty 1

## 2024-07-29 MED ORDER — ACETAMINOPHEN 325 MG PO TABS: 650.0000 mg | ORAL_TABLET | Freq: Four times a day (QID) | ORAL | Status: DC | PRN

## 2024-07-29 MED ORDER — PANTOPRAZOLE SODIUM 40 MG PO TBEC
40.0000 mg | DELAYED_RELEASE_TABLET | Freq: Every day | ORAL | Status: DC
  Administered 2024-07-30 – 2024-07-31 (×2): 40 mg via ORAL
  Filled 2024-07-29 (×2): qty 1

## 2024-07-29 MED ORDER — DIPHENHYDRAMINE HCL 50 MG/ML IJ SOLN: 12.5000 mg | Freq: Four times a day (QID) | INTRAMUSCULAR | Status: DC | PRN

## 2024-07-29 MED ORDER — THYROID 30 MG PO TABS
45.0000 mg | ORAL_TABLET | ORAL | Status: DC
  Administered 2024-07-30 – 2024-07-31 (×2): 45 mg via ORAL
  Filled 2024-07-29 (×3): qty 2

## 2024-07-29 MED ORDER — ONDANSETRON 4 MG PO TBDP
4.0000 mg | ORAL_TABLET | Freq: Four times a day (QID) | ORAL | Status: DC | PRN
  Administered 2024-07-29: 4 mg via ORAL
  Filled 2024-07-29: qty 1

## 2024-07-29 MED ORDER — TRAMADOL HCL 50 MG PO TABS: 50.0000 mg | ORAL_TABLET | Freq: Four times a day (QID) | ORAL | Status: DC | PRN

## 2024-07-29 MED ORDER — LIFITEGRAST 5 % OP SOLN: 1.0000 [drp] | Freq: Every day | OPHTHALMIC | Status: DC

## 2024-07-29 MED ORDER — DIPHENHYDRAMINE HCL 12.5 MG/5ML PO ELIX
12.5000 mg | ORAL_SOLUTION | Freq: Four times a day (QID) | ORAL | Status: DC | PRN
  Administered 2024-07-29: 12.5 mg via ORAL
  Filled 2024-07-29: qty 10

## 2024-07-29 MED ORDER — ONDANSETRON HCL 4 MG/2ML IJ SOLN: 4.0000 mg | Freq: Four times a day (QID) | INTRAMUSCULAR | Status: DC | PRN

## 2024-07-29 MED ORDER — LACTATED RINGERS IV SOLN: INTRAVENOUS | Status: AC

## 2024-07-29 NOTE — Plan of Care (Signed)

## 2024-07-29 NOTE — H&P (Signed)
 Barbara Michael 1961/09/27  [id number].     HPI:  Barbara Michael is a 63 yo female who underwent a lap cholecystectomy on 8/14 for symptomatic cholelithiasis. She was doing well postoperatively, but this week has been having weakness and nausea. She was sent for outpatient labs, which showed an elevated in LFTs (Tbili 2.7, alk phos 440), which was new compared to prior labs. She was sent for an MRCP yesterday, which showed a stone in the common bile duct. She was direct admitted to [facility] today. She reports ongoing nausea, malaise and weakness. She has also noticed her urine is dark, and her stools have been lighter in color. She denies fevers. She has mild RUQ pain.  ROS: Review of Systems  Constitutional:  Positive for malaise/fatigue. Negative for chills and fever.  Respiratory:  Negative for shortness of breath.   Gastrointestinal:  Positive for nausea.    Family History  Problem Relation Age of Onset   Stroke Mother    Stroke Maternal Grandmother     Past Medical History:  Diagnosis Date   Allergy     Anxiety    Arthritis    Dyslipidemia    GERD (gastroesophageal reflux disease)    Hypothyroidism    Sinusitis    Sleep apnea    Vertigo     Past Surgical History:  Procedure Laterality Date   CARPAL TUNNEL RELEASE Right 2025   CARPAL TUNNEL RELEASE Left    CHOLECYSTECTOMY N/A 06/24/2024   Procedure: LAPAROSCOPIC CHOLECYSTECTOMY WITH ICG DYE;  Surgeon: Dasie Leonor CROME, MD;  Location: [facility] OR;  Service: General;  Laterality: N/A;   COLONOSCOPY  06/2024   LUMBAR LAMINECTOMY/DECOMPRESSION WITH DISCECTOMY  2019   Dr. Arley Helling   TONSILLECTOMY     As a teenager    Social History:  reports that she has never smoked. She has never used smokeless tobacco. She reports that she does not currently use alcohol. She reports that she does not use drugs.  Allergies:  Allergies  Allergen Reactions   Erythromycin Hives   Keflex [Cephalexin] Diarrhea and Nausea And Vomiting    Penicillins Hives   Sulfa Antibiotics Hives   Codeine Hives    Facility-Administered Medications Prior to Admission  Medication Dose Route Frequency Provider Last Rate Last Admin   triamcinolone  acetonide (KENALOG ) 10 MG/ML injection 10 mg  10 mg Intra-articular Once        Medications Prior to Admission  Medication Sig Dispense Refill   ALPRAZolam  (XANAX ) 0.5 MG tablet Take 0.25 mg by mouth 2 (two) times daily as needed for anxiety.     ARMOUR THYROID  90 MG tablet Take 45 mg by mouth every morning.     buPROPion  (WELLBUTRIN  XL) 150 MG 24 hr tablet Take 150 mg by mouth daily.     HYDROcodone -acetaminophen  (NORCO/VICODIN) 5-325 MG tablet Take 1 tablet by mouth every 6 (six) hours as needed for moderate pain (pain score 4-6). 15 tablet 0   Lactobacillus (ACIDOPHILUS PROBIOTIC) CAPS Take 1 capsule by mouth daily.     levocetirizine (XYZAL) 5 MG tablet Take 5 mg by mouth every evening.     Lifitegrast  (XIIDRA ) 5 % SOLN Place 1 drop into both eyes daily.     Omega-3 Fatty Acids (MINI OMEGA-3 BURP-LESS PO) Take 2 capsules by mouth daily.     omeprazole (PRILOSEC) 20 MG capsule Take 20 mg by mouth daily.     progesterone (PROMETRIUM) 200 MG capsule Take 200 mg by mouth 3 (three) times  a week.       Physical Exam: Blood pressure 120/76, pulse 96, temperature 99.3 F (37.4 C), temperature source Oral, resp. rate 18, SpO2 97%. General: resting comfortably, appears stated age, no apparent distress Neurological: alert and oriented, no focal deficits, cranial nerves grossly in tact HEENT: normocephalic, atraumatic, scleral icterus Respiratory: normal work of breathing on room air Abdomen: soft, nondistended, incisions are healing well with no erythema, induration or drainage. Extremities: warm and well-perfused, no deformities, moving all extremities spontaneously    No results found for this or any previous visit (from the past 48 hours). MR ABDOMEN MRCP W WO CONTAST Result Date:  07/28/2024 CLINICAL DATA:  Elevated liver function tests. 1 month status post laparoscopic cholecystectomy. EXAM: MRI ABDOMEN WITHOUT AND WITH CONTRAST (INCLUDING MRCP) TECHNIQUE: Multiplanar multisequence MR imaging of the abdomen was performed both before and after the administration of intravenous contrast. Heavily T2-weighted images of the biliary and pancreatic ducts were obtained, and three-dimensional MRCP images were rendered by post processing. CONTRAST:  10mL GADAVIST  GADOBUTROL  1 MMOL/ML IV SOLN COMPARISON:  None Available. FINDINGS: Lower chest: No acute findings. Hepatobiliary: Postop changes are seen from recent cholecystectomy. No evidence of biliary ductal dilatation, with common bile duct measuring 7 mm. A small calculus measuring approximately 5 mm is seen in the distal common bile duct. A rim enhancing fluid collection is seen along the posteromedial margin of the right hepatic lobe, measuring 6.1 x 4.2 cm. This is consistent with a postop fluid collection. No intrahepatic masses are identified. Prior cholecystectomy. No evidence of biliary obstruction. Pancreas: No evidence of pancreatic mass or ductal dilatation. No evidence of peripancreatic inflammatory changes or fluid collections. Spleen:  Within normal limits in size and appearance. Adrenals/Urinary Tract: No suspicious masses identified. No evidence of hydronephrosis. Mild edema is seen in the right perinephric space. Stomach/Bowel: Unremarkable. Vascular/Lymphatic: No pathologically enlarged lymph nodes identified. No acute vascular findings. Other:  None. Musculoskeletal:  No suspicious bone lesions identified. IMPRESSION: Postop changes from recent cholecystectomy. 5 mm calculus in distal common bile duct, without evidence of biliary ductal dilatation. 6 cm rim enhancing postop fluid collection along the posteromedial margin of the right hepatic lobe. Abscess and biloma cannot be excluded. Consider nuclear medicine hepatobiliary scan  for further evaluation. These results will be called to the ordering clinician or representative by the Radiologist Assistant, and communication documented in the PACS or [employer]. Electronically Signed   By: Norleen DELENA Kil M.D.   On: 07/28/2024 16:46   MR 3D Recon At Scanner Result Date: 07/28/2024 CLINICAL DATA:  Elevated liver function tests. 1 month status post laparoscopic cholecystectomy. EXAM: MRI ABDOMEN WITHOUT AND WITH CONTRAST (INCLUDING MRCP) TECHNIQUE: Multiplanar multisequence MR imaging of the abdomen was performed both before and after the administration of intravenous contrast. Heavily T2-weighted images of the biliary and pancreatic ducts were obtained, and three-dimensional MRCP images were rendered by post processing. CONTRAST:  10mL GADAVIST  GADOBUTROL  1 MMOL/ML IV SOLN COMPARISON:  None Available. FINDINGS: Lower chest: No acute findings. Hepatobiliary: Postop changes are seen from recent cholecystectomy. No evidence of biliary ductal dilatation, with common bile duct measuring 7 mm. A small calculus measuring approximately 5 mm is seen in the distal common bile duct. A rim enhancing fluid collection is seen along the posteromedial margin of the right hepatic lobe, measuring 6.1 x 4.2 cm. This is consistent with a postop fluid collection. No intrahepatic masses are identified. Prior cholecystectomy. No evidence of biliary obstruction. Pancreas: No evidence of pancreatic mass  or ductal dilatation. No evidence of peripancreatic inflammatory changes or fluid collections. Spleen:  Within normal limits in size and appearance. Adrenals/Urinary Tract: No suspicious masses identified. No evidence of hydronephrosis. Mild edema is seen in the right perinephric space. Stomach/Bowel: Unremarkable. Vascular/Lymphatic: No pathologically enlarged lymph nodes identified. No acute vascular findings. Other:  None. Musculoskeletal:  No suspicious bone lesions identified. IMPRESSION: Postop changes from  recent cholecystectomy. 5 mm calculus in distal common bile duct, without evidence of biliary ductal dilatation. 6 cm rim enhancing postop fluid collection along the posteromedial margin of the right hepatic lobe. Abscess and biloma cannot be excluded. Consider nuclear medicine hepatobiliary scan for further evaluation. These results will be called to the ordering clinician or representative by the Radiologist Assistant, and communication documented in the PACS or [employer]. Electronically Signed   By: Norleen DELENA Kil M.D.   On: 07/28/2024 16:46      Assessment/Plan 63 yo female one month s/p laparoscopic cholecystectomy, now with elevated LFTs and choledocholithiasis. MRCP confirms a retained gallstone in the common bile duct. - GI consult for ERCP - Regular diet, NPO at midnight - Pain and nausea control - IV fluid hydration - Will hold on abx in absence of fevers and leukocytosis - Labs pending today - VTE: SCDs, hold chemical DVT ppx for ERCP - Dispo: admit to inpatient   Leonor Dawn, MD [facility] General, Hepatobiliary and Pancreatic Surgery 07/29/24 6:01 PM

## 2024-07-30 ENCOUNTER — Encounter (HOSPITAL_COMMUNITY)
Admission: RE | Disposition: A | Discharge status: Home / Self Care | Payer: Self-pay | Source: Ambulatory Visit | Attending: Surgery

## 2024-07-30 ENCOUNTER — Inpatient Hospital Stay (HOSPITAL_COMMUNITY)

## 2024-07-30 ENCOUNTER — Encounter (HOSPITAL_COMMUNITY): Payer: Self-pay | Admitting: Surgery

## 2024-07-30 DIAGNOSIS — K8050 Calculus of bile duct without cholangitis or cholecystitis without obstruction: Secondary | ICD-10-CM | POA: Diagnosis not present

## 2024-07-30 HISTORY — PX: ERCP: SHX5425

## 2024-07-30 LAB — COMPREHENSIVE METABOLIC PANEL WITH GFR
ALT: 135 U/L — ABNORMAL HIGH (ref 0–44)
AST: 56 U/L — ABNORMAL HIGH (ref 15–41)
Albumin: 2.7 g/dL — ABNORMAL LOW (ref 3.5–5.0)
Alkaline Phosphatase: 253 U/L — ABNORMAL HIGH (ref 38–126)
Anion gap: 9 (ref 5–15)
BUN: 7 mg/dL — ABNORMAL LOW (ref 8–23)
CO2: 24 mmol/L (ref 22–32)
Calcium: 8.9 mg/dL (ref 8.9–10.3)
Chloride: 102 mmol/L (ref 98–111)
Creatinine, Ser: 0.68 mg/dL (ref 0.44–1.00)
GFR, Estimated: >60 mL/min (ref >60)
Glucose, Bld: 103 mg/dL — ABNORMAL HIGH (ref 70–99)
Potassium: 3.6 mmol/L (ref 3.5–5.1)
Sodium: 135 mmol/L (ref 135–145)
Total Bilirubin: 3.8 mg/dL — ABNORMAL HIGH (ref 0.0–1.2)
Total Protein: 6.7 g/dL (ref 6.5–8.1)

## 2024-07-30 LAB — CBC
HCT: 37.9 % (ref 36.0–46.0)
Hemoglobin: 12.3 g/dL (ref 12.0–15.0)
MCH: 28.3 pg (ref 26.0–34.0)
MCHC: 32.5 g/dL (ref 30.0–36.0)
MCV: 87.3 fL (ref 80.0–100.0)
Platelets: 213 K/uL (ref 150–400)
RBC: 4.34 MIL/uL (ref 3.87–5.11)
RDW: 14.4 % (ref 11.5–15.5)
WBC: 10.4 K/uL (ref 4.0–10.5)
nRBC: 0 % (ref 0.0–0.2)

## 2024-07-30 SURGERY — ERCP, WITH INTERVENTION IF INDICATED
Anesthesia: General

## 2024-07-30 MED ORDER — GLUCAGON HCL RDNA (DIAGNOSTIC) 1 MG IJ SOLR
INTRAMUSCULAR | Status: AC
  Filled 2024-07-30: qty 1

## 2024-07-30 MED ORDER — DICLOFENAC SUPPOSITORY 100 MG
RECTAL | Status: DC | PRN
Start: 2024-07-30 — End: 2024-07-30
  Administered 2024-07-30: 100 mg via RECTAL

## 2024-07-30 MED ORDER — ROCURONIUM BROMIDE 10 MG/ML (PF) SYRINGE
PREFILLED_SYRINGE | INTRAVENOUS | Status: DC | PRN
  Administered 2024-07-30: 60 mg via INTRAVENOUS

## 2024-07-30 MED ORDER — SODIUM CHLORIDE 0.9 % IV SOLN: INTRAVENOUS | Status: DC

## 2024-07-30 MED ORDER — SUGAMMADEX SODIUM 200 MG/2ML IV SOLN
INTRAVENOUS | Status: DC | PRN
  Administered 2024-07-30: 200 mg via INTRAVENOUS

## 2024-07-30 MED ORDER — SODIUM CHLORIDE 0.9 % IV SOLN
INTRAVENOUS | Status: DC | PRN
  Administered 2024-07-30: 35 mL

## 2024-07-30 MED ORDER — PROPOFOL 10 MG/ML IV BOLUS
INTRAVENOUS | Status: DC | PRN
  Administered 2024-07-30: 200 mg via INTRAVENOUS

## 2024-07-30 MED ORDER — CIPROFLOXACIN IN D5W 400 MG/200ML IV SOLN
INTRAVENOUS | Status: AC
  Filled 2024-07-30: qty 200

## 2024-07-30 MED ORDER — SUCCINYLCHOLINE CHLORIDE 200 MG/10ML IV SOSY
PREFILLED_SYRINGE | INTRAVENOUS | Status: DC | PRN
  Administered 2024-07-30: 100 mg via INTRAVENOUS

## 2024-07-30 MED ORDER — CIPROFLOXACIN IN D5W 400 MG/200ML IV SOLN
INTRAVENOUS | Status: DC | PRN
  Administered 2024-07-30: 400 mg via INTRAVENOUS

## 2024-07-30 MED ORDER — DICLOFENAC SUPPOSITORY 100 MG
RECTAL | Status: AC
  Filled 2024-07-30: qty 1

## 2024-07-30 MED ORDER — MENTHOL 3 MG MT LOZG
1.0000 | LOZENGE | OROMUCOSAL | Status: DC | PRN
  Administered 2024-07-30: 3 mg via ORAL
  Filled 2024-07-30: qty 9

## 2024-07-30 MED ORDER — ONDANSETRON HCL 4 MG/2ML IJ SOLN
INTRAMUSCULAR | Status: DC | PRN
Start: 2024-07-30 — End: 2024-07-30
  Administered 2024-07-30: 4 mg via INTRAVENOUS

## 2024-07-30 MED ORDER — DEXAMETHASONE SODIUM PHOSPHATE 10 MG/ML IJ SOLN
INTRAMUSCULAR | Status: DC | PRN
  Administered 2024-07-30: 4 mg via INTRAVENOUS

## 2024-07-30 MED ORDER — ACETAMINOPHEN 325 MG PO TABS: 650.0000 mg | ORAL_TABLET | Freq: Four times a day (QID) | ORAL | Status: AC | PRN

## 2024-07-30 MED ORDER — LIDOCAINE 2% (20 MG/ML) 5 ML SYRINGE
INTRAMUSCULAR | Status: DC | PRN
  Administered 2024-07-30: 100 mg via INTRAVENOUS

## 2024-07-30 MED ORDER — LACTATED RINGERS IV SOLN
INTRAVENOUS | Status: AC | PRN
Start: 2024-07-30 — End: 2024-07-30
  Administered 2024-07-30: 1000 mL via INTRAVENOUS

## 2024-07-30 NOTE — Anesthesia Procedure Notes (Signed)
 Procedure Name: Intubation Date/Time: 07/30/2024 2:04 PM  Performed by: Lockie Flesher, CRNAPre-anesthesia Checklist: Patient identified, Emergency Drugs available, Suction available and Patient being monitored Patient Re-evaluated:Patient Re-evaluated prior to induction Oxygen Delivery Method: Circle System Utilized Preoxygenation: Pre-oxygenation with 100% oxygen Induction Type: IV induction Ventilation: Mask ventilation without difficulty Laryngoscope Size: Mac and 3 Grade View: Grade III Tube type: Oral Tube size: 7.0 mm Number of attempts: 2 Airway Equipment and Method: Stylet and Oral airway Placement Confirmation: ETT inserted through vocal cords under direct vision, positive ETCO2 and breath sounds checked- equal and bilateral Secured at: 22 cm Tube secured with: Tape Dental Injury: Teeth and Oropharynx as per pre-operative assessment

## 2024-07-30 NOTE — Discharge Instructions (Addendum)
 [facility] DISCHARGE INSTRUCTIONS  Activity You may resume your regular activities as tolerated.  Medications A  prescription for pain medication may be given to you upon discharge.  Take your pain medication as prescribed, if needed.  If narcotic pain medicine is not needed, then you may take acetaminophen  (Tylenol ) or ibuprofen (Advil) as needed. It is common to experience some constipation if taking pain medication after surgery.  Increasing fluid intake and taking a stool softener (such as Colace) will usually help or prevent this problem from occurring.  A mild laxative (Milk of Magnesia or Miralax) should be taken according to package directions if there are no bowel movements after 48 hours. Take your usually prescribed medications unless otherwise directed. If you need a refill on your pain medication, please contact your pharmacy.  They will contact our office to request authorization. Prescriptions will not be filled after 5 pm or on weekends.  When to Call Us : Fever greater than 100.5 New redness, drainage, or swelling at incision site Severe pain, nausea, or vomiting Persistent bleeding from incisions Jaundice (yellowing of the whites of the eyes or skin)  Follow-up You have an appointment scheduled with Dr. Dasie on September 29 at 3:10pm. This will be at the [facility] office at [address], [address], [place], [place]. Please arrive at least 15 minutes prior to your scheduled appointment time.  IF YOU HAVE DISABILITY OR FAMILY LEAVE FORMS, YOU MUST BRING THEM TO THE OFFICE FOR PROCESSING.   DO NOT GIVE THEM TO YOUR DOCTOR.  The clinic staff is available to answer your questions during regular business hours.  Please don't hesitate to call and ask to speak to one of the nurses for clinical concerns.  If you have a medical emergency, go to the nearest emergency room or call 911.  A surgeon from [facility] is always on call at the  hospital  [address], [place], [place]  [address] ?  [address] [address], [place], [place]   [address] [phone] ? Toll Free: [phone] ? FAX [phone] Web site: [email or web address]      Managing Your Pain After Surgery Without Opioids    Thank you for participating in our program to help patients manage their pain after surgery without opioids. This is part of our effort to provide you with the best care possible, without exposing you or your family to the risk that opioids pose.  What pain can I expect after surgery? You can expect to have some pain after surgery. This is normal. The pain is typically worse the day after surgery, and quickly begins to get better. Many studies have found that many patients are able to manage their pain after surgery with Over-the-Counter (OTC) medications such as Tylenol  and Motrin. If you have a condition that does not allow you to take Tylenol  or Motrin, notify your surgical team.  How will I manage my pain? The best strategy for controlling your pain after surgery is around the clock pain control with Tylenol  (acetaminophen ) and Motrin (ibuprofen or Advil). Alternating these medications with each other allows you to maximize your pain control. In addition to Tylenol  and Motrin, you can use heating pads or ice packs on your incisions to help reduce your pain.  How will I alternate your regular strength over-the-counter pain medication? You will take a dose of pain medication every three hours. Start by taking 650 mg of Tylenol  (2 pills of 325 mg) 3  hours later take 600 mg of Motrin (3 pills of 200 mg) 3 hours after taking the Motrin take 650 mg of Tylenol  3 hours after that take 600 mg of Motrin.   - 1 -  See example - if your first dose of Tylenol  is at 12:00 PM   12:00 PM Tylenol  650 mg (2 pills of 325 mg)  3:00 PM Motrin 600 mg (3 pills of 200 mg)  6:00 PM Tylenol  650 mg (2 pills of 325 mg)  9:00 PM  Motrin 600 mg (3 pills of 200 mg)  Continue alternating every 3 hours   We recommend that you follow this schedule around-the-clock for at least 3 days after surgery, or until you feel that it is no longer needed. Use the table on the last page of this handout to keep track of the medications you are taking. Important: Do not take more than 3000mg  of Tylenol  or 3200mg  of Motrin in a 24-hour period. Do not take ibuprofen/Motrin if you have a history of bleeding stomach ulcers, severe kidney disease, &/or actively taking a blood thinner  What if I still have pain? If you have pain that is not controlled with the over-the-counter pain medications (Tylenol  and Motrin or Advil) you might have what we call "breakthrough" pain. You will receive a prescription for a small amount of an opioid pain medication such as Oxycodone , Tramadol , or Tylenol  with Codeine. Use these opioid pills in the first 24 hours after surgery if you have breakthrough pain. Do not take more than 1 pill every 4-6 hours.  If you still have uncontrolled pain after using all opioid pills, don't hesitate to call our staff using the number provided. We will help make sure you are managing your pain in the best way possible, and if necessary, we can provide a prescription for additional pain medication.   Day 1    Time  Name of Medication Number of pills taken  Amount of Acetaminophen   Pain Level   Comments  AM PM       AM PM       AM PM       AM PM       AM PM       AM PM       AM PM       AM PM       Total Daily amount of Acetaminophen  Do not take more than  3,000 mg per day      Day 2    Time  Name of Medication Number of pills taken  Amount of Acetaminophen   Pain Level   Comments  AM PM       AM PM       AM PM       AM PM       AM PM       AM PM       AM PM       AM PM       Total Daily amount of Acetaminophen  Do not take more than  3,000 mg per day      Day 3    Time  Name of Medication Number  of pills taken  Amount of Acetaminophen   Pain Level   Comments  AM PM       AM PM       AM PM       AM PM         AM PM  AM PM       AM PM       AM PM       Total Daily amount of Acetaminophen  Do not take more than  3,000 mg per day      Day 4    Time  Name of Medication Number of pills taken  Amount of Acetaminophen   Pain Level   Comments  AM PM       AM PM       AM PM       AM PM       AM PM       AM PM       AM PM       AM PM       Total Daily amount of Acetaminophen  Do not take more than  3,000 mg per day      Day 5    Time  Name of Medication Number of pills taken  Amount of Acetaminophen   Pain Level   Comments  AM PM       AM PM       AM PM       AM PM       AM PM       AM PM       AM PM       AM PM       Total Daily amount of Acetaminophen  Do not take more than  3,000 mg per day      Day 6    Time  Name of Medication Number of pills taken  Amount of Acetaminophen   Pain Level  Comments  AM PM       AM PM       AM PM       AM PM       AM PM       AM PM       AM PM       AM PM       Total Daily amount of Acetaminophen  Do not take more than  3,000 mg per day      Day 7    Time  Name of Medication Number of pills taken  Amount of Acetaminophen   Pain Level   Comments  AM PM       AM PM       AM PM       AM PM       AM PM       AM PM       AM PM       AM PM       Total Daily amount of Acetaminophen  Do not take more than  3,000 mg per day        For additional information about how and where to safely dispose of unused opioid medications - [email or web address]  Disclaimer: This document contains information and/or instructional materials adapted from [facility] for the typical patient with your condition. It does not replace medical advice from your health care provider because your experience may differ from that of the typical patient. Talk to your health care provider if you have any  questions about this document, your condition or your treatment plan. Adapted from [facility]

## 2024-07-30 NOTE — Anesthesia Preprocedure Evaluation (Signed)
 Anesthesia Evaluation  Patient identified by MRN, date of birth, ID band Patient awake    Reviewed: Allergy  & Precautions, NPO status , Patient's Chart, lab work & pertinent test results  Airway Mallampati: II  TM Distance: >3 FB Neck ROM: Full    Dental no notable dental hx.    Pulmonary sleep apnea    Pulmonary exam normal        Cardiovascular negative cardio ROS  Rhythm:Regular Rate:Normal     Neuro/Psych   Anxiety     negative neurological ROS     GI/Hepatic Neg liver ROS,GERD  Medicated,,  Endo/Other  Hypothyroidism    Renal/GU negative Renal ROS  negative genitourinary   Musculoskeletal  (+) Arthritis ,    Abdominal Normal abdominal exam  (+)   Peds  Hematology Lab Results      Component                Value               Date                      WBC                      10.4                07/30/2024                HGB                      12.3                07/30/2024                HCT                      37.9                07/30/2024                MCV                      87.3                07/30/2024                PLT                      213                 07/30/2024             Lab Results      Component                Value               Date                      NA                       135                 07/30/2024                K  3.6                 07/30/2024                CO2                      24                  07/30/2024                GLUCOSE                  103 (H)             07/30/2024                BUN                      7 (L)               07/30/2024                CREATININE               0.68                07/30/2024                CALCIUM                  8.9                 07/30/2024                GFRNONAA                 >60                 07/30/2024              Anesthesia Other Findings   Reproductive/Obstetrics                               Anesthesia Physical Anesthesia Plan  ASA: 2  Anesthesia Plan: General   Post-op Pain Management:    Induction: Intravenous  PONV Risk Score and Plan: 3 and Ondansetron , Dexamethasone , Midazolam  and Treatment may vary due to age or medical condition  Airway Management Planned: Mask and Oral ETT  Additional Equipment: None  Intra-op Plan:   Post-operative Plan: Extubation in OR  Informed Consent: I have reviewed the patients History and Physical, chart, labs and discussed the procedure including the risks, benefits and alternatives for the proposed anesthesia with the patient or authorized representative who has indicated his/her understanding and acceptance.     Dental advisory given  Plan Discussed with: CRNA  Anesthesia Plan Comments:         Anesthesia Quick Evaluation

## 2024-07-30 NOTE — Anesthesia Postprocedure Evaluation (Signed)
 Anesthesia Post Note  Patient: Barbara Michael  Procedure(s) Performed: ERCP, WITH INTERVENTION IF INDICATED     Patient location during evaluation: PACU Anesthesia Type: General Level of consciousness: awake Pain management: pain level controlled Vital Signs Assessment: post-procedure vital signs reviewed and stable Respiratory status: spontaneous breathing, nonlabored ventilation and respiratory function stable Cardiovascular status: blood pressure returned to baseline and stable Postop Assessment: no apparent nausea or vomiting Anesthetic complications: no   No notable events documented.  Last Vitals:  Vitals:   07/30/24 1500 07/30/24 1510  BP: 127/82 119/83  Pulse: 96 87  Resp: 19 (!) 23  Temp:    SpO2: 94% 95%    Last Pain:  Vitals:   07/30/24 1510  TempSrc:   PainSc: 0-No pain                 Delon Aisha Arch

## 2024-07-30 NOTE — Transfer of Care (Signed)
 Immediate Anesthesia Transfer of Care Note  Patient: Barbara Michael  Procedure(s) Performed: ERCP, WITH INTERVENTION IF INDICATED  Patient Location: Endoscopy Unit  Anesthesia Type:General  Level of Consciousness: awake, alert , and oriented  Airway & Oxygen Therapy: Patient Spontanous Breathing and Patient connected to nasal cannula oxygen  Post-op Assessment: Report given to RN and Post -op Vital signs reviewed and stable  Post vital signs: Reviewed and stable  Last Vitals:  Vitals Value Taken Time  BP 124/88 07/30/24 14:51  Temp 36.5 C 07/30/24 14:51  Pulse 104 07/30/24 14:54  Resp 17 07/30/24 14:54  SpO2 94 % 07/30/24 14:54  Vitals shown include unfiled device data.  Last Pain:  Vitals:   07/30/24 1451  TempSrc: Temporal  PainSc: 0-No pain         Complications: No notable events documented.

## 2024-07-30 NOTE — Consult Note (Signed)
 Reason for Consult:Choledocholithiasis Referring Physician: Dr. Leonor Dawn  Barbara Michael HPI: This is a 63 year old female s/p lap chole on 06/24/2024 for symptomatic gallstones.  The surgery went well, but over this past week she started to experience symptoms of nausea, and weakness.  Further evaluation with blood work showed a marked elevation in her liver enzymes:  AST 69, ALT 161, AP 284, and TB 7.5.  Previously her values were normal:  AST 28, ALT 24, AP 110, and TB 1.4.  The MRCP was positive for a 5 mm stone in the distal CBD.  A 6 cm rim enhancing post op fluid collection was also noted.  There was the concern for a biloma or a liver abscess.  Past Medical History:  Diagnosis Date   Allergy     Anxiety    Arthritis    Dyslipidemia    GERD (gastroesophageal reflux disease)    Hypothyroidism    Sinusitis    Sleep apnea    Vertigo     Past Surgical History:  Procedure Laterality Date   CARPAL TUNNEL RELEASE Right 2025   CARPAL TUNNEL RELEASE Left    CHOLECYSTECTOMY N/A 06/24/2024   Procedure: LAPAROSCOPIC CHOLECYSTECTOMY WITH ICG DYE;  Surgeon: Dawn Leonor CROME, MD;  Location: [facility] OR;  Service: General;  Laterality: N/A;   COLONOSCOPY  06/2024   LUMBAR LAMINECTOMY/DECOMPRESSION WITH DISCECTOMY  2019   Dr. Arley Helling   TONSILLECTOMY     As a teenager    Family History  Problem Relation Age of Onset   Stroke Mother    Stroke Maternal Grandmother     Social History:  reports that she has never smoked. She has never used smokeless tobacco. She reports that she does not currently use alcohol. She reports that she does not use drugs.  Allergies:  Allergies  Allergen Reactions   Erythromycin Hives   Keflex [Cephalexin] Diarrhea and Nausea And Vomiting   Penicillins Hives   Sulfa Antibiotics Hives   Codeine Hives    Medications: Scheduled:  [MAR Hold] buPROPion   150 mg Oral Daily   [MAR Hold] Lifitegrast   1 drop Both Eyes Daily   [MAR Hold] pantoprazole   40 mg Oral  Daily   [MAR Hold] thyroid   45 mg Oral [id number]   Continuous:  sodium chloride      lactated ringers  75 mL/hr at 07/29/24 1835   lactated ringers       Results for orders placed or performed during the hospital encounter of 07/29/24 (from the past 24 hours)  CBC     Status: Abnormal   Collection Time: 07/29/24  6:41 PM  Result Value Ref Range   WBC 11.3 (H) 4.0 - 10.5 K/uL   RBC 4.65 3.87 - 5.11 MIL/uL   Hemoglobin 13.0 12.0 - 15.0 g/dL   HCT 59.9 63.9 - 53.9 %   MCV 86.0 80.0 - 100.0 fL   MCH 28.0 26.0 - 34.0 pg   MCHC 32.5 30.0 - 36.0 g/dL   RDW 85.5 88.4 - 84.4 %   Platelets 262 150 - 400 K/uL   nRBC 0.0 0.0 - 0.2 %  Comprehensive metabolic panel     Status: Abnormal   Collection Time: 07/29/24  6:41 PM  Result Value Ref Range   Sodium 137 135 - 145 mmol/L   Potassium 3.5 3.5 - 5.1 mmol/L   Chloride 100 98 - 111 mmol/L   CO2 22 22 - 32 mmol/L   Glucose, Bld 99 70 - 99 mg/dL  BUN 8 8 - 23 mg/dL   Creatinine, Ser 9.29 0.44 - 1.00 mg/dL   Calcium 9.2 8.9 - 89.6 mg/dL   Total Protein 7.0 6.5 - 8.1 g/dL   Albumin 2.9 (L) 3.5 - 5.0 g/dL   AST 69 (H) 15 - 41 U/L   ALT 161 (H) 0 - 44 U/L   Alkaline Phosphatase 284 (H) 38 - 126 U/L   Total Bilirubin 7.5 (H) 0.0 - 1.2 mg/dL   GFR, Estimated >39 >39 mL/min   Anion gap 15 5 - 15  Comprehensive metabolic panel with GFR     Status: Abnormal   Collection Time: 07/30/24  5:32 AM  Result Value Ref Range   Sodium 135 135 - 145 mmol/L   Potassium 3.6 3.5 - 5.1 mmol/L   Chloride 102 98 - 111 mmol/L   CO2 24 22 - 32 mmol/L   Glucose, Bld 103 (H) 70 - 99 mg/dL   BUN 7 (L) 8 - 23 mg/dL   Creatinine, Ser 9.31 0.44 - 1.00 mg/dL   Calcium 8.9 8.9 - 89.6 mg/dL   Total Protein 6.7 6.5 - 8.1 g/dL   Albumin 2.7 (L) 3.5 - 5.0 g/dL   AST 56 (H) 15 - 41 U/L   ALT 135 (H) 0 - 44 U/L   Alkaline Phosphatase 253 (H) 38 - 126 U/L   Total Bilirubin 3.8 (H) 0.0 - 1.2 mg/dL   GFR, Estimated >39 >39 mL/min   Anion gap 9 5 - 15  CBC     Status:  None   Collection Time: 07/30/24  5:32 AM  Result Value Ref Range   WBC 10.4 4.0 - 10.5 K/uL   RBC 4.34 3.87 - 5.11 MIL/uL   Hemoglobin 12.3 12.0 - 15.0 g/dL   HCT 62.0 63.9 - 53.9 %   MCV 87.3 80.0 - 100.0 fL   MCH 28.3 26.0 - 34.0 pg   MCHC 32.5 30.0 - 36.0 g/dL   RDW 85.5 88.4 - 84.4 %   Platelets 213 150 - 400 K/uL   nRBC 0.0 0.0 - 0.2 %     MR ABDOMEN MRCP W WO CONTAST Result Date: 07/28/2024 CLINICAL DATA:  Elevated liver function tests. 1 month status post laparoscopic cholecystectomy. EXAM: MRI ABDOMEN WITHOUT AND WITH CONTRAST (INCLUDING MRCP) TECHNIQUE: Multiplanar multisequence MR imaging of the abdomen was performed both before and after the administration of intravenous contrast. Heavily T2-weighted images of the biliary and pancreatic ducts were obtained, and three-dimensional MRCP images were rendered by post processing. CONTRAST:  10mL GADAVIST  GADOBUTROL  1 MMOL/ML IV SOLN COMPARISON:  None Available. FINDINGS: Lower chest: No acute findings. Hepatobiliary: Postop changes are seen from recent cholecystectomy. No evidence of biliary ductal dilatation, with common bile duct measuring 7 mm. A small calculus measuring approximately 5 mm is seen in the distal common bile duct. A rim enhancing fluid collection is seen along the posteromedial margin of the right hepatic lobe, measuring 6.1 x 4.2 cm. This is consistent with a postop fluid collection. No intrahepatic masses are identified. Prior cholecystectomy. No evidence of biliary obstruction. Pancreas: No evidence of pancreatic mass or ductal dilatation. No evidence of peripancreatic inflammatory changes or fluid collections. Spleen:  Within normal limits in size and appearance. Adrenals/Urinary Tract: No suspicious masses identified. No evidence of hydronephrosis. Mild edema is seen in the right perinephric space. Stomach/Bowel: Unremarkable. Vascular/Lymphatic: No pathologically enlarged lymph nodes identified. No acute vascular  findings. Other:  None. Musculoskeletal:  No suspicious bone lesions identified.  IMPRESSION: Postop changes from recent cholecystectomy. 5 mm calculus in distal common bile duct, without evidence of biliary ductal dilatation. 6 cm rim enhancing postop fluid collection along the posteromedial margin of the right hepatic lobe. Abscess and biloma cannot be excluded. Consider nuclear medicine hepatobiliary scan for further evaluation. These results will be called to the ordering clinician or representative by the Radiologist Assistant, and communication documented in the PACS or [employer]. Electronically Signed   By: Norleen DELENA Kil M.D.   On: 07/28/2024 16:46   MR 3D Recon At Scanner Result Date: 07/28/2024 CLINICAL DATA:  Elevated liver function tests. 1 month status post laparoscopic cholecystectomy. EXAM: MRI ABDOMEN WITHOUT AND WITH CONTRAST (INCLUDING MRCP) TECHNIQUE: Multiplanar multisequence MR imaging of the abdomen was performed both before and after the administration of intravenous contrast. Heavily T2-weighted images of the biliary and pancreatic ducts were obtained, and three-dimensional MRCP images were rendered by post processing. CONTRAST:  10mL GADAVIST  GADOBUTROL  1 MMOL/ML IV SOLN COMPARISON:  None Available. FINDINGS: Lower chest: No acute findings. Hepatobiliary: Postop changes are seen from recent cholecystectomy. No evidence of biliary ductal dilatation, with common bile duct measuring 7 mm. A small calculus measuring approximately 5 mm is seen in the distal common bile duct. A rim enhancing fluid collection is seen along the posteromedial margin of the right hepatic lobe, measuring 6.1 x 4.2 cm. This is consistent with a postop fluid collection. No intrahepatic masses are identified. Prior cholecystectomy. No evidence of biliary obstruction. Pancreas: No evidence of pancreatic mass or ductal dilatation. No evidence of peripancreatic inflammatory changes or fluid collections. Spleen:   Within normal limits in size and appearance. Adrenals/Urinary Tract: No suspicious masses identified. No evidence of hydronephrosis. Mild edema is seen in the right perinephric space. Stomach/Bowel: Unremarkable. Vascular/Lymphatic: No pathologically enlarged lymph nodes identified. No acute vascular findings. Other:  None. Musculoskeletal:  No suspicious bone lesions identified. IMPRESSION: Postop changes from recent cholecystectomy. 5 mm calculus in distal common bile duct, without evidence of biliary ductal dilatation. 6 cm rim enhancing postop fluid collection along the posteromedial margin of the right hepatic lobe. Abscess and biloma cannot be excluded. Consider nuclear medicine hepatobiliary scan for further evaluation. These results will be called to the ordering clinician or representative by the Radiologist Assistant, and communication documented in the PACS or [employer]. Electronically Signed   By: Norleen DELENA Kil M.D.   On: 07/28/2024 16:46    ROS:  As stated above in the HPI otherwise negative.  Blood pressure (!) 143/84, pulse 85, temperature 97.7 F (36.5 C), temperature source Temporal, resp. rate 19, height 5' 7 (1.702 m), weight 120.3 kg, SpO2 95%.    PE: Gen: NAD, Alert and Oriented HEENT:  [place]/AT, EOMI Neck: Supple, no LAD Lungs: CTA Bilaterally CV: RRR without M/G/R ABD: Soft, NTND, +BS Ext: No C/C/E  Assessment/Plan: 1) Choledocholithiasis. 2) Bile lead - ? Biloma.   An ERCP with stone extraction will be performed.  She will also be checked for any bile leaks.  If a leak is present a stent will be placed.  Plan: 1) ERCP with stone extraction.  Jovanni Rash D 07/30/2024, 1:55 PM

## 2024-07-30 NOTE — Op Note (Signed)
 [facility] Patient Name: Barbara Michael Procedure Date : 07/30/2024 MRN: [id number] Attending MD: Belvie Just , MD, [id number] Date of Birth: 08-29-1961 CSN: [id number] Age: 63 Admit Type: Inpatient Procedure:                ERCP Indications:              Common bile duct stone(s) Providers:                Belvie Just, MD, Hoy Penner, RN, Coye Bade, Technician Referring MD:             Leonor CROME. Allen Medicines:                General Anesthesia Complications:            No immediate complications. Estimated Blood Loss:     Estimated blood loss: none. Procedure:                Pre-Anesthesia Assessment:                           - Prior to the procedure, a History and Physical                            was performed, and patient medications and                            allergies were reviewed. The patient's tolerance of                            previous anesthesia was also reviewed. The risks                            and benefits of the procedure and the sedation                            options and risks were discussed with the patient.                            All questions were answered, and informed consent                            was obtained. Prior Anticoagulants: The patient has                            taken no anticoagulant or antiplatelet agents. ASA                            Grade Assessment: II - A patient with mild systemic                            disease. After reviewing the risks and benefits,  the patient was deemed in satisfactory condition to                            undergo the procedure.                           - Sedation was administered by an anesthesia                            professional. General anesthesia was attained.                           After obtaining informed consent, the scope was                            passed under direct vision.  Throughout the                            procedure, the patient's blood pressure, pulse, and                            oxygen saturations were monitored continuously. The                            [id number]-Q190L ([id number] Olympus duodenoscope was                            introduced through the mouth, and used to inject                            contrast into and used to inject contrast into the                            bile duct. The ERCP was accomplished without                            difficulty. The patient tolerated the procedure                            well. Scope In: Scope Out: Findings:      The major papilla was normal. The bile duct was deeply cannulated with       the short-nosed traction sphincterotome. Contrast was injected. I       personally interpreted the bile duct images. There was brisk flow of       contrast through the ducts. Image quality was excellent. Contrast       extended to the entire biliary tree. The common bile duct contained       filling defect(s) thought to be a stone. A short 0.035 inch Soft Jagwire       was passed into the biliary tree. A 10 mm biliary sphincterotomy was       made with a traction (standard) sphincterotome using ERBE       electrocautery. There was no post-sphincterotomy bleeding. The biliary       tree was swept with a 12 mm balloon starting  at the bifurcation. Sludge       was swept from the duct.      The CBD was easily cannulated during the first attempt. The guidewire       was secured in the right intrahepatic ducts. Contrast injection revealed       a faint CBD filling defect. Close attention was paid to the cystic duct       stump and there was no evidence of any bile leaks. There were no leaks       from any accessory ducts. A 1 cm sphincterotomy was created and there       was rapid drainage of clear bile. The CBD was swept multiple times with       the 12 mm balloon. Sludge was the only material removed during the  first       pass. There was no evidence of any stones. A final occlusion       cholangiogram was performed the balloon was held in place distal to the       cystic duct. Under pressure the cystic duct filled and distended. There       was no evidence of any leaks or retained stones in the cystic duct or       CBD. Prolonged observation was performed to ensure that there was no       delay with any cystic ducts leak. No leak was detected. Impression:               - The major papilla appeared normal.                           - A filling defect consistent with a stone was seen                            on the cholangiogram.                           - A biliary sphincterotomy was performed.                           - The biliary tree was swept and sludge was found. Recommendation:           - Return patient to hospital ward for ongoing care.                           - Resume regular diet.                           - Continue present medications. Procedure Code(s):        --- Professional ---                           [phone], Endoscopic retrograde                            cholangiopancreatography (ERCP); with removal of                            calculi/debris from biliary/pancreatic duct(s)  [id number], Endoscopic retrograde                            cholangiopancreatography (ERCP); with                            sphincterotomy/papillotomy                           [phone], Endoscopic catheterization of the biliary                            ductal system, radiological supervision and                            interpretation Diagnosis Code(s):        --- Professional ---                           K80.50, Calculus of bile duct without cholangitis                            or cholecystitis without obstruction                           R93.2, Abnormal findings on diagnostic imaging of                            liver and biliary tract CPT copyright 2022 American  Medical Association. All rights reserved. The codes documented in this report are preliminary and upon coder review may  be revised to meet current compliance requirements. Belvie Just, MD Belvie Just, MD 07/30/2024 3:45:59 PM This report has been signed electronically. Number of Addenda: 0

## 2024-07-30 NOTE — Progress Notes (Signed)
 Subjective: Subjectively feels better today. Tbili was 7.5 at admission last night, down to 3.8 this morning. Remains afebrile. WBC 10.4.   Objective: Vital signs in last 24 hours: Temp:  [98.1 F (36.7 C)-99.5 F (37.5 C)] 98.9 F (37.2 C) (09/19 0518) Pulse Rate:  [81-97] 81 (09/19 0518) Resp:  [17-20] 17 (09/19 0518) BP: (120-140)/(67-79) 120/79 (09/19 0518) SpO2:  [94 %-97 %] 96 % (09/19 0518) Weight:  [120.3 kg] 120.3 kg (09/18 1838) Last BM Date : 07/29/24  Intake/Output from previous day: No intake/output data recorded. Intake/Output this shift: No intake/output data recorded.  PE: General: resting comfortably, NAD Neuro: alert and oriented, no focal deficits Resp: normal work of breathing on room air Abdomen: soft, nondistended, nontender to palpation. Incisions healing well. Extremities: warm and well-perfused   Lab Results:  Recent Labs    07/29/24 1841 07/30/24 0532  WBC 11.3* 10.4  HGB 13.0 12.3  HCT 40.0 37.9  PLT 262 213   BMET Recent Labs    07/29/24 1841 07/30/24 0532  NA 137 135  K 3.5 3.6  CL 100 102  CO2 22 24  GLUCOSE 99 103*  BUN 8 7*  CREATININE 0.70 0.68  CALCIUM 9.2 8.9   PT/INR No results for input(s): LABPROT, INR in the last 72 hours. CMP     Component Value Date/Time   NA 135 07/30/2024 0532   K 3.6 07/30/2024 0532   CL 102 07/30/2024 0532   CO2 24 07/30/2024 0532   GLUCOSE 103 (H) 07/30/2024 0532   BUN 7 (L) 07/30/2024 0532   CREATININE 0.68 07/30/2024 0532   CALCIUM 8.9 07/30/2024 0532   PROT 6.7 07/30/2024 0532   ALBUMIN 2.7 (L) 07/30/2024 0532   AST 56 (H) 07/30/2024 0532   ALT 135 (H) 07/30/2024 0532   ALKPHOS 253 (H) 07/30/2024 0532   BILITOT 3.8 (H) 07/30/2024 0532   GFRNONAA >60 07/30/2024 0532   Lipase  No results found for: LIPASE     Studies/Results: MR ABDOMEN MRCP W WO CONTAST Result Date: 07/28/2024 CLINICAL DATA:  Elevated liver function tests. 1 month status post laparoscopic  cholecystectomy. EXAM: MRI ABDOMEN WITHOUT AND WITH CONTRAST (INCLUDING MRCP) TECHNIQUE: Multiplanar multisequence MR imaging of the abdomen was performed both before and after the administration of intravenous contrast. Heavily T2-weighted images of the biliary and pancreatic ducts were obtained, and three-dimensional MRCP images were rendered by post processing. CONTRAST:  10mL GADAVIST  GADOBUTROL  1 MMOL/ML IV SOLN COMPARISON:  None Available. FINDINGS: Lower chest: No acute findings. Hepatobiliary: Postop changes are seen from recent cholecystectomy. No evidence of biliary ductal dilatation, with common bile duct measuring 7 mm. A small calculus measuring approximately 5 mm is seen in the distal common bile duct. A rim enhancing fluid collection is seen along the posteromedial margin of the right hepatic lobe, measuring 6.1 x 4.2 cm. This is consistent with a postop fluid collection. No intrahepatic masses are identified. Prior cholecystectomy. No evidence of biliary obstruction. Pancreas: No evidence of pancreatic mass or ductal dilatation. No evidence of peripancreatic inflammatory changes or fluid collections. Spleen:  Within normal limits in size and appearance. Adrenals/Urinary Tract: No suspicious masses identified. No evidence of hydronephrosis. Mild edema is seen in the right perinephric space. Stomach/Bowel: Unremarkable. Vascular/Lymphatic: No pathologically enlarged lymph nodes identified. No acute vascular findings. Other:  None. Musculoskeletal:  No suspicious bone lesions identified. IMPRESSION: Postop changes from recent cholecystectomy. 5 mm calculus in distal common bile duct, without evidence of  biliary ductal dilatation. 6 cm rim enhancing postop fluid collection along the posteromedial margin of the right hepatic lobe. Abscess and biloma cannot be excluded. Consider nuclear medicine hepatobiliary scan for further evaluation. These results will be called to the ordering clinician or  representative by the Radiologist Assistant, and communication documented in the PACS or [employer]. Electronically Signed   By: Norleen DELENA Kil M.D.   On: 07/28/2024 16:46   MR 3D Recon At Scanner Result Date: 07/28/2024 CLINICAL DATA:  Elevated liver function tests. 1 month status post laparoscopic cholecystectomy. EXAM: MRI ABDOMEN WITHOUT AND WITH CONTRAST (INCLUDING MRCP) TECHNIQUE: Multiplanar multisequence MR imaging of the abdomen was performed both before and after the administration of intravenous contrast. Heavily T2-weighted images of the biliary and pancreatic ducts were obtained, and three-dimensional MRCP images were rendered by post processing. CONTRAST:  10mL GADAVIST  GADOBUTROL  1 MMOL/ML IV SOLN COMPARISON:  None Available. FINDINGS: Lower chest: No acute findings. Hepatobiliary: Postop changes are seen from recent cholecystectomy. No evidence of biliary ductal dilatation, with common bile duct measuring 7 mm. A small calculus measuring approximately 5 mm is seen in the distal common bile duct. A rim enhancing fluid collection is seen along the posteromedial margin of the right hepatic lobe, measuring 6.1 x 4.2 cm. This is consistent with a postop fluid collection. No intrahepatic masses are identified. Prior cholecystectomy. No evidence of biliary obstruction. Pancreas: No evidence of pancreatic mass or ductal dilatation. No evidence of peripancreatic inflammatory changes or fluid collections. Spleen:  Within normal limits in size and appearance. Adrenals/Urinary Tract: No suspicious masses identified. No evidence of hydronephrosis. Mild edema is seen in the right perinephric space. Stomach/Bowel: Unremarkable. Vascular/Lymphatic: No pathologically enlarged lymph nodes identified. No acute vascular findings. Other:  None. Musculoskeletal:  No suspicious bone lesions identified. IMPRESSION: Postop changes from recent cholecystectomy. 5 mm calculus in distal common bile duct, without evidence  of biliary ductal dilatation. 6 cm rim enhancing postop fluid collection along the posteromedial margin of the right hepatic lobe. Abscess and biloma cannot be excluded. Consider nuclear medicine hepatobiliary scan for further evaluation. These results will be called to the ordering clinician or representative by the Radiologist Assistant, and communication documented in the PACS or [employer]. Electronically Signed   By: Norleen DELENA Kil M.D.   On: 07/28/2024 16:46    Anti-infectives: Anti-infectives (From admission, onward)    None        Assessment/Plan 63 yo female one month s/p laparoscopic cholecystectomy, now with elevated LFTs. - MRCP confirms choledocholithiasis. GI consulted, Dr. Rollin tentatively planning for ERCP today. - Gallbladder fossa fluid collection also noted on MRI, likely seroma vs biloma. WBC is normal. If no evidence of cystic duct stump leak on ERCP, and patient clinically improves, will monitor this collection. If concern for bile leak, will request percutaneous drainage by IR. - NPO for procedure, maintenance IV fluids - Home meds ordered as appropriate - VTE: SCDs, chemical DVT ppx on hold for procedure - Dispo: inpatient, med-surg     LOS: 1 day    Leonor Dawn, MD [facility], Hepatobiliary and Pancreatic Surgery 07/30/24 7:14 AM

## 2024-07-31 DIAGNOSIS — K8050 Calculus of bile duct without cholangitis or cholecystitis without obstruction: Secondary | ICD-10-CM | POA: Diagnosis not present

## 2024-07-31 LAB — COMPREHENSIVE METABOLIC PANEL WITH GFR
ALT: 114 U/L — ABNORMAL HIGH (ref 0–44)
AST: 39 U/L (ref 15–41)
Albumin: 2.7 g/dL — ABNORMAL LOW (ref 3.5–5.0)
Alkaline Phosphatase: 228 U/L — ABNORMAL HIGH (ref 38–126)
Anion gap: 10 (ref 5–15)
BUN: 9 mg/dL (ref 8–23)
CO2: 27 mmol/L (ref 22–32)
Calcium: 9.6 mg/dL (ref 8.9–10.3)
Chloride: 103 mmol/L (ref 98–111)
Creatinine, Ser: 0.6 mg/dL (ref 0.44–1.00)
GFR, Estimated: >60 mL/min (ref >60)
Glucose, Bld: 104 mg/dL — ABNORMAL HIGH (ref 70–99)
Potassium: 4.6 mmol/L (ref 3.5–5.1)
Sodium: 140 mmol/L (ref 135–145)
Total Bilirubin: 1.8 mg/dL — ABNORMAL HIGH (ref 0.0–1.2)
Total Protein: 7.2 g/dL (ref 6.5–8.1)

## 2024-07-31 LAB — CBC
HCT: 39.2 % (ref 36.0–46.0)
Hemoglobin: 12.6 g/dL (ref 12.0–15.0)
MCH: 28.1 pg (ref 26.0–34.0)
MCHC: 32.1 g/dL (ref 30.0–36.0)
MCV: 87.5 fL (ref 80.0–100.0)
Platelets: 270 K/uL (ref 150–400)
RBC: 4.48 MIL/uL (ref 3.87–5.11)
RDW: 13.9 % (ref 11.5–15.5)
WBC: 6.9 K/uL (ref 4.0–10.5)
nRBC: 0 % (ref 0.0–0.2)

## 2024-07-31 NOTE — Discharge Summary (Signed)
    Physician Discharge Summary   Patient ID: Barbara Michael MRN: [id number] DOB/AGE: 12-06-1960 63 y.o.  Admit date: 07/29/2024  Discharge date: 07/31/2024  Discharge Diagnoses:  Principal Problem:   Choledocholithiasis   Discharged Condition: good  Hospital Course: Patient was admitted for observation following ERCP with stone extraction by Dr. Belvie Just.  Post op course was uncomplicated.  Pain was well controlled.  Tolerated diet.  Patient was prepared for discharge home on POD#1.  Consults: GI  Treatments: ERCP with stone extraction  Discharge Exam: Blood pressure 116/73, pulse 61, temperature (!) 97.5 F (36.4 C), temperature source Oral, resp. rate 18, height 5' 7 (1.702 m), weight 120.3 kg, SpO2 97%. HEENT - clear Abd - mild upper abd tenderness, no guarding, no mass  Disposition: Home  Discharge Instructions     Diet - low sodium heart healthy   Complete by: As directed    Increase activity slowly   Complete by: As directed    No wound care   Complete by: As directed       Allergies as of 07/31/2024       Reactions   Erythromycin Hives   Keflex [cephalexin] Diarrhea, Nausea And Vomiting   Penicillins Hives   Sulfa Antibiotics Hives   Codeine Hives        Medication List     STOP taking these medications    HYDROcodone -acetaminophen  5-325 MG tablet Commonly known as: NORCO/VICODIN       TAKE these medications    acetaminophen  325 MG tablet Commonly known as: TYLENOL  Take 2 tablets (650 mg total) by mouth every 6 (six) hours as needed for mild pain (pain score 1-3).   Acidophilus Probiotic Caps Take 1 capsule by mouth daily.   ALPRAZolam  0.5 MG tablet Commonly known as: XANAX  Take 0.25 mg by mouth 2 (two) times daily as needed for anxiety.   Armour Thyroid  90 MG tablet Generic drug: thyroid  Take 45 mg by mouth every morning.   buPROPion  150 MG 24 hr tablet Commonly known as: WELLBUTRIN  XL Take 150 mg by mouth daily.    levocetirizine 5 MG tablet Commonly known as: XYZAL Take 5 mg by mouth every evening.   MINI OMEGA-3 BURP-LESS PO Take 2 capsules by mouth daily.   omeprazole 20 MG capsule Commonly known as: PRILOSEC Take 20 mg by mouth daily.   progesterone 200 MG capsule Commonly known as: PROMETRIUM Take 200 mg by mouth See admin instructions. Take one tablet by mouth on Monday Wednesdays and Fridays   Xiidra  5 % Soln Generic drug: Lifitegrast  Place 1 drop into both eyes daily.        Follow-up Information     Dasie Leonor CROME, MD. Schedule an appointment as soon as possible for a visit in 2 week(s).   Specialty: General Surgery Why: For wound re-check Contact information: [address] [place] [address] [phone]                 Krystal Spinner, MD [facility] Office: [phone]   Signed: Krystal Spinner 07/31/2024, 9:33 AM

## 2024-07-31 NOTE — Plan of Care (Signed)

## 2024-08-02 ENCOUNTER — Encounter (HOSPITAL_COMMUNITY): Payer: Self-pay | Admitting: Gastroenterology

## 2024-11-18 ENCOUNTER — Encounter: Payer: Self-pay | Admitting: Podiatry

## 2024-11-18 ENCOUNTER — Ambulatory Visit: Admitting: Podiatry

## 2024-11-18 DIAGNOSIS — M7672 Peroneal tendinitis, left leg: Secondary | ICD-10-CM

## 2024-11-18 MED ORDER — TRIAMCINOLONE ACETONIDE 10 MG/ML IJ SUSP
10.0000 mg | Freq: Once | INTRAMUSCULAR | Status: AC
  Administered 2024-11-18: 10 mg via INTRA_ARTICULAR

## 2024-11-22 NOTE — Progress Notes (Signed)
 Subjective:   Patient ID: Barbara Michael, female   DOB: 64 y.o.   MRN: [id number]   HPI Patient states she has developed a lot of pain in the outside of her left foot and states that it gets real tender and makes it hard to walk and she has been on her feet a lot   ROS      Objective:  Physical Exam  Neurovascular status intact with inflammation pain of the lateral side of the left foot with fluid buildup around the area that is painful when pressed     Assessment:  Peroneal tendinitis left with inflammation fluid buildup     Plan:  H&P reviewed recommended injection explained risk sterile prep injected the peroneal tendon at insertion 3 mg dexamethasone  Kenalog  5 mg Xylocaine  and applied sterile dressing
